# Patient Record
Sex: Male | Born: 1985 | Race: White | Hispanic: No | Marital: Married
Health system: Southern US, Academic
[De-identification: ages and names within clinical notes are randomized; demographics above are authoritative.]

---

## 2006-05-08 ENCOUNTER — Emergency Department (HOSPITAL_COMMUNITY): Payer: Self-pay | Admitting: EXTERNAL

## 2017-07-09 ENCOUNTER — Inpatient Hospital Stay: Payer: Self-pay

## 2017-07-09 ENCOUNTER — Emergency Department: Payer: Self-pay

## 2017-07-09 ENCOUNTER — Inpatient Hospital Stay
Admission: EM | Admit: 2017-07-09 | Discharge: 2017-07-12 | DRG: 917 | Disposition: A | Discharge status: Home / Self Care | Payer: Self-pay | Attending: Internal Medicine | Admitting: Internal Medicine

## 2017-07-09 ENCOUNTER — Other Ambulatory Visit: Payer: Self-pay

## 2017-07-09 DIAGNOSIS — T50992A Poisoning by other drugs, medicaments and biological substances, intentional self-harm, initial encounter: Secondary | ICD-10-CM

## 2017-07-09 DIAGNOSIS — Z4659 Encounter for fitting and adjustment of other gastrointestinal appliance and device: Secondary | ICD-10-CM

## 2017-07-09 DIAGNOSIS — T50902A Poisoning by unspecified drugs, medicaments and biological substances, intentional self-harm, initial encounter: Secondary | ICD-10-CM

## 2017-07-09 DIAGNOSIS — G9340 Encephalopathy, unspecified: Secondary | ICD-10-CM | POA: Diagnosis present

## 2017-07-09 DIAGNOSIS — R000 Tachycardia, unspecified: Secondary | ICD-10-CM | POA: Diagnosis present

## 2017-07-09 DIAGNOSIS — F1110 Opioid abuse, uncomplicated: Secondary | ICD-10-CM | POA: Diagnosis present

## 2017-07-09 DIAGNOSIS — Z91040 Latex allergy status: Secondary | ICD-10-CM

## 2017-07-09 DIAGNOSIS — F419 Anxiety disorder, unspecified: Secondary | ICD-10-CM | POA: Diagnosis present

## 2017-07-09 DIAGNOSIS — R319 Hematuria, unspecified: Secondary | ICD-10-CM | POA: Diagnosis present

## 2017-07-09 DIAGNOSIS — IMO0001 Reserved for inherently not codable concepts without codable children: Secondary | ICD-10-CM

## 2017-07-09 DIAGNOSIS — F329 Major depressive disorder, single episode, unspecified: Secondary | ICD-10-CM | POA: Diagnosis present

## 2017-07-09 DIAGNOSIS — I959 Hypotension, unspecified: Secondary | ICD-10-CM | POA: Diagnosis present

## 2017-07-09 DIAGNOSIS — T43201A Poisoning by unspecified antidepressants, accidental (unintentional), initial encounter: Secondary | ICD-10-CM | POA: Diagnosis present

## 2017-07-09 DIAGNOSIS — J9600 Acute respiratory failure, unspecified whether with hypoxia or hypercapnia: Secondary | ICD-10-CM | POA: Diagnosis present

## 2017-07-09 DIAGNOSIS — R258 Other abnormal involuntary movements: Secondary | ICD-10-CM | POA: Diagnosis present

## 2017-07-09 DIAGNOSIS — T50901A Poisoning by unspecified drugs, medicaments and biological substances, accidental (unintentional), initial encounter: Secondary | ICD-10-CM

## 2017-07-09 DIAGNOSIS — R410 Disorientation, unspecified: Secondary | ICD-10-CM

## 2017-07-09 DIAGNOSIS — R338 Other retention of urine: Secondary | ICD-10-CM | POA: Diagnosis present

## 2017-07-09 DIAGNOSIS — Z781 Physical restraint status: Secondary | ICD-10-CM

## 2017-07-09 DIAGNOSIS — J9601 Acute respiratory failure with hypoxia: Secondary | ICD-10-CM

## 2017-07-09 DIAGNOSIS — T43212A Poisoning by selective serotonin and norepinephrine reuptake inhibitors, intentional self-harm, initial encounter: Principal | ICD-10-CM | POA: Diagnosis present

## 2017-07-09 LAB — CBC WITH DIFFERENTIAL/PLATELET
BASOS ABS: 0.1 10*3/uL (ref 0–0.1)
BASOS PCT: 0 %
BASOS PCT: 0 %
Basophils Absolute: 0.1 10*3/uL (ref 0–0.1)
EOS ABS: 0 10*3/uL (ref 0–0.7)
EOS PCT: 2 %
EOS PCT: 0 %
Eosinophils Absolute: 0.2 10*3/uL (ref 0–0.7)
HCT: 41.9 % (ref 40.0–52.0)
HEMATOCRIT: 46.9 % (ref 40.0–52.0)
Hemoglobin: 15.7 g/dL (ref 13.0–18.0)
Hemoglobin: 13.9 g/dL (ref 13.0–18.0)
Lymphocytes Relative: 8 %
Lymphocytes Relative: 5 %
Lymphs Abs: 1.1 10*3/uL (ref 1.0–3.6)
Lymphs Abs: 0.9 10*3/uL — ABNORMAL LOW (ref 1.0–3.6)
MCH: 31.2 pg (ref 26.0–34.0)
MCH: 30.7 pg (ref 26.0–34.0)
MCHC: 33.4 g/dL (ref 32.0–36.0)
MCHC: 33.2 g/dL (ref 32.0–36.0)
MCV: 93.5 fL (ref 80.0–100.0)
MCV: 92.6 fL (ref 80.0–100.0)
MONO ABS: 1.3 10*3/uL — AB (ref 0.2–1.0)
MONO ABS: 1.7 10*3/uL — AB (ref 0.2–1.0)
MONOS PCT: 9 %
Monocytes Relative: 9 %
Neutro Abs: 12.4 10*3/uL — ABNORMAL HIGH (ref 1.4–6.5)
Neutro Abs: 15.6 10*3/uL — ABNORMAL HIGH (ref 1.4–6.5)
Neutrophils Relative %: 81 %
Neutrophils Relative %: 86 %
PLATELETS: 186 10*3/uL (ref 150–440)
PLATELETS: 180 10*3/uL (ref 150–440)
RBC: 5.02 MIL/uL (ref 4.40–5.90)
RBC: 4.53 MIL/uL (ref 4.40–5.90)
RDW: 13.8 % (ref 11.5–14.5)
RDW: 13.4 % (ref 11.5–14.5)
WBC: 15.2 10*3/uL — ABNORMAL HIGH (ref 3.8–10.6)
WBC: 18.1 10*3/uL — ABNORMAL HIGH (ref 3.8–10.6)

## 2017-07-09 LAB — URINE DRUG SCREEN, QUALITATIVE (ARMC ONLY)
Amphetamines, Ur Screen: POSITIVE — AB
BENZODIAZEPINE, UR SCRN: POSITIVE — AB
CANNABINOID 50 NG, UR ~~LOC~~: POSITIVE — AB
Cocaine Metabolite,Ur ~~LOC~~: NOT DETECTED
MDMA (Ecstasy)Ur Screen: NOT DETECTED
METHADONE SCREEN, URINE: NOT DETECTED
OPIATE, UR SCREEN: NOT DETECTED
PHENCYCLIDINE (PCP) UR S: POSITIVE — AB
Tricyclic, Ur Screen: NOT DETECTED

## 2017-07-09 LAB — BLOOD GAS, ARTERIAL
ACID-BASE DEFICIT: 1.2 mmol/L (ref 0.0–2.0)
Acid-base deficit: 0.9 mmol/L (ref 0.0–2.0)
Bicarbonate: 24.3 mmol/L (ref 20.0–28.0)
Bicarbonate: 22.7 mmol/L (ref 20.0–28.0)
DELIVERY SYSTEMS: POSITIVE
Expiratory PAP: 5
FIO2: 0.4
FIO2: 0.6
INSPIRATORY PAP: 15
MECHANICAL RATE: 10
O2 SAT: 99.4 %
O2 Saturation: 95 %
PCO2 ART: 41 mmHg (ref 32.0–48.0)
PCO2 ART: 35 mmHg (ref 32.0–48.0)
PEEP: 5 cmH2O
Patient temperature: 37
Patient temperature: 37
RATE: 16 resp/min
VT: 500 mL
pH, Arterial: 7.38 (ref 7.350–7.450)
pH, Arterial: 7.42 (ref 7.350–7.450)
pO2, Arterial: 158 mmHg — ABNORMAL HIGH (ref 83.0–108.0)
pO2, Arterial: 74 mmHg — ABNORMAL LOW (ref 83.0–108.0)

## 2017-07-09 LAB — TRIGLYCERIDES: Triglycerides: 152 mg/dL — ABNORMAL HIGH (ref <150)

## 2017-07-09 LAB — COMPREHENSIVE METABOLIC PANEL
ALT: 12 U/L (ref 0–44)
AST: 26 U/L (ref 15–41)
Albumin: 3.6 g/dL (ref 3.5–5.0)
Alkaline Phosphatase: 63 U/L (ref 38–126)
Anion gap: 8 (ref 5–15)
BILIRUBIN TOTAL: 0.3 mg/dL (ref 0.3–1.2)
BUN: 10 mg/dL (ref 6–20)
CALCIUM: 8 mg/dL — AB (ref 8.9–10.3)
CO2: 23 mmol/L (ref 22–32)
Chloride: 109 mmol/L (ref 98–111)
Creatinine, Ser: 1.19 mg/dL (ref 0.61–1.24)
GFR calc Af Amer: >60 mL/min (ref >60)
Glucose, Bld: 87 mg/dL (ref 70–99)
Potassium: 3.5 mmol/L (ref 3.5–5.1)
Sodium: 140 mmol/L (ref 135–145)
TOTAL PROTEIN: 6.1 g/dL — AB (ref 6.5–8.1)

## 2017-07-09 LAB — PHOSPHORUS: Phosphorus: 4 mg/dL (ref 2.5–4.6)

## 2017-07-09 LAB — BASIC METABOLIC PANEL
Anion gap: 8 (ref 5–15)
BUN: 11 mg/dL (ref 6–20)
CO2: 20 mmol/L — ABNORMAL LOW (ref 22–32)
CREATININE: 0.89 mg/dL (ref 0.61–1.24)
Calcium: 7.5 mg/dL — ABNORMAL LOW (ref 8.9–10.3)
Chloride: 112 mmol/L — ABNORMAL HIGH (ref 98–111)
GFR calc Af Amer: >60 mL/min (ref >60)
GLUCOSE: 97 mg/dL (ref 70–99)
Potassium: 3.8 mmol/L (ref 3.5–5.1)
SODIUM: 140 mmol/L (ref 135–145)

## 2017-07-09 LAB — LACTIC ACID, PLASMA
LACTIC ACID, VENOUS: 3.6 mmol/L — AB (ref 0.5–1.9)
Lactic Acid, Venous: 2.4 mmol/L (ref 0.5–1.9)

## 2017-07-09 LAB — CK: Total CK: 130 U/L (ref 49–397)

## 2017-07-09 LAB — GLUCOSE, CAPILLARY: GLUCOSE-CAPILLARY: 148 mg/dL — AB (ref 70–99)

## 2017-07-09 LAB — PROTIME-INR
INR: 1.13
Prothrombin Time: 14.4 seconds (ref 11.4–15.2)

## 2017-07-09 LAB — ETHANOL

## 2017-07-09 LAB — MAGNESIUM: Magnesium: 1.6 mg/dL — ABNORMAL LOW (ref 1.7–2.4)

## 2017-07-09 LAB — MRSA PCR SCREENING: MRSA by PCR: NEGATIVE

## 2017-07-09 LAB — ACETAMINOPHEN LEVEL: Acetaminophen (Tylenol), Serum: <10 ug/mL — ABNORMAL LOW (ref 10–30)

## 2017-07-09 LAB — SALICYLATE LEVEL: Salicylate Lvl: <7 mg/dL (ref 2.8–30.0)

## 2017-07-09 MED ORDER — ACTIDOSE WITH SORBITOL 50 GM/240ML PO LIQD
50.0000 g | Freq: Once | ORAL | Status: AC
  Administered 2017-07-09: 50 g via ORAL

## 2017-07-09 MED ORDER — FENTANYL CITRATE (PF) 100 MCG/2ML IJ SOLN: 50.0000 ug | Freq: Once | INTRAMUSCULAR | Status: DC

## 2017-07-09 MED ORDER — CYPROHEPTADINE HCL 4 MG PO TABS
2.0000 mg | ORAL_TABLET | ORAL | Status: AC
  Administered 2017-07-09 (×3): 2 mg via ORAL
  Filled 2017-07-09 (×12): qty 1

## 2017-07-09 MED ORDER — DEXMEDETOMIDINE HCL IN NACL 400 MCG/100ML IV SOLN
0.4000 ug/kg/h | INTRAVENOUS | Status: DC
  Administered 2017-07-09 (×2): 0.4 ug/kg/h via INTRAVENOUS
  Administered 2017-07-10: 0.2 ug/kg/h via INTRAVENOUS
  Administered 2017-07-10: 1.6 ug/kg/h via INTRAVENOUS
  Administered 2017-07-10: 1.8 ug/kg/h via INTRAVENOUS
  Filled 2017-07-09 (×6): qty 100

## 2017-07-09 MED ORDER — SODIUM CHLORIDE 0.9 % IV SOLN: 250.0000 mL | INTRAVENOUS | Status: DC | PRN

## 2017-07-09 MED ORDER — SODIUM BICARBONATE 8.4 % IV SOLN
50.0000 meq | Freq: Once | INTRAVENOUS | Status: AC
  Administered 2017-07-09: 50 meq via INTRAVENOUS
  Filled 2017-07-09: qty 50

## 2017-07-09 MED ORDER — FENTANYL CITRATE (PF) 100 MCG/2ML IJ SOLN
100.0000 ug | Freq: Once | INTRAMUSCULAR | Status: AC
Start: 2017-07-09 — End: 2017-07-09
  Administered 2017-07-09: 100 ug via INTRAVENOUS

## 2017-07-09 MED ORDER — NOREPINEPHRINE 4 MG/250ML-% IV SOLN
0.0000 ug/min | INTRAVENOUS | Status: DC
  Administered 2017-07-09: 4 ug/min via INTRAVENOUS
  Filled 2017-07-09: qty 250

## 2017-07-09 MED ORDER — LORAZEPAM 2 MG/ML IJ SOLN
4.0000 mg | Freq: Once | INTRAMUSCULAR | Status: AC
  Administered 2017-07-09: 4 mg via INTRAVENOUS

## 2017-07-09 MED ORDER — FENTANYL CITRATE (PF) 100 MCG/2ML IJ SOLN
100.0000 ug | INTRAMUSCULAR | Status: DC | PRN
  Filled 2017-07-09: qty 2

## 2017-07-09 MED ORDER — CHARCOAL ACTIVATED PO LIQD
ORAL | Status: AC
  Filled 2017-07-09: qty 240

## 2017-07-09 MED ORDER — CYPROHEPTADINE HCL 4 MG PO TABS
4.0000 mg | ORAL_TABLET | Freq: Once | ORAL | Status: DC
  Filled 2017-07-09: qty 1

## 2017-07-09 MED ORDER — SODIUM CHLORIDE 0.9 % IV BOLUS
2000.0000 mL | Freq: Once | INTRAVENOUS | Status: AC
  Administered 2017-07-09: 2000 mL via INTRAVENOUS

## 2017-07-09 MED ORDER — FAMOTIDINE IN NACL 20-0.9 MG/50ML-% IV SOLN
20.0000 mg | INTRAVENOUS | Status: DC
  Administered 2017-07-09: 20 mg via INTRAVENOUS
  Filled 2017-07-09: qty 50

## 2017-07-09 MED ORDER — PROPOFOL 1000 MG/100ML IV EMUL
5.0000 ug/kg/min | Freq: Once | INTRAVENOUS | Status: DC
  Administered 2017-07-09: 20 ug/kg/min via INTRAVENOUS

## 2017-07-09 MED ORDER — ENOXAPARIN SODIUM 40 MG/0.4ML ~~LOC~~ SOLN: 40.0000 mg | SUBCUTANEOUS | Status: DC

## 2017-07-09 MED ORDER — LORAZEPAM 2 MG/ML IJ SOLN
2.0000 mg | Freq: Four times a day (QID) | INTRAMUSCULAR | Status: DC
  Administered 2017-07-09 – 2017-07-10 (×4): 2 mg via INTRAVENOUS
  Filled 2017-07-09 (×4): qty 1

## 2017-07-09 MED ORDER — LORAZEPAM 2 MG/ML IJ SOLN: 1.0000 mg | INTRAMUSCULAR | Status: DC | PRN

## 2017-07-09 MED ORDER — SODIUM CHLORIDE 0.9 % IV BOLUS
1000.0000 mL | Freq: Once | INTRAVENOUS | Status: AC
  Administered 2017-07-09: 1000 mL via INTRAVENOUS

## 2017-07-09 MED ORDER — PROPOFOL 1000 MG/100ML IV EMUL
INTRAVENOUS | Status: AC
  Administered 2017-07-09: 20 ug/kg/min via INTRAVENOUS
  Filled 2017-07-09: qty 100

## 2017-07-09 MED ORDER — LACTATED RINGERS IV SOLN
INTRAVENOUS | Status: DC
  Administered 2017-07-09: 05:00:00 via INTRAVENOUS

## 2017-07-09 MED ORDER — LORAZEPAM 2 MG/ML IJ SOLN
1.0000 mg | INTRAMUSCULAR | Status: DC | PRN
  Administered 2017-07-10: 2 mg via INTRAVENOUS
  Administered 2017-07-11 (×3): 1 mg via INTRAVENOUS
  Filled 2017-07-09 (×4): qty 1

## 2017-07-09 MED ORDER — ROCURONIUM BROMIDE 50 MG/5ML IV SOLN
100.0000 mg | Freq: Once | INTRAVENOUS | Status: AC
  Administered 2017-07-09: 100 mg via INTRAVENOUS

## 2017-07-09 MED ORDER — PROPOFOL 1000 MG/100ML IV EMUL
0.0000 ug/kg/min | INTRAVENOUS | Status: DC
  Administered 2017-07-09: 10 ug/kg/min via INTRAVENOUS
  Filled 2017-07-09: qty 100

## 2017-07-09 MED ORDER — KETAMINE HCL 10 MG/ML IJ SOLN
100.0000 mg | Freq: Once | INTRAMUSCULAR | Status: AC
  Administered 2017-07-09: 100 mg via INTRAVENOUS

## 2017-07-09 MED ORDER — FENTANYL 2500MCG IN NS 250ML (10MCG/ML) PREMIX INFUSION: 100.0000 ug/h | INTRAVENOUS | Status: DC

## 2017-07-09 MED ORDER — MAGNESIUM SULFATE 2 GM/50ML IV SOLN
2.0000 g | Freq: Once | INTRAVENOUS | Status: AC
  Administered 2017-07-09: 2 g via INTRAVENOUS

## 2017-07-09 MED ORDER — PROPOFOL 1000 MG/100ML IV EMUL: 5.0000 ug/kg/min | Freq: Once | INTRAVENOUS | Status: DC

## 2017-07-09 MED ORDER — ZIPRASIDONE MESYLATE 20 MG IM SOLR
20.0000 mg | INTRAMUSCULAR | Status: DC | PRN
  Administered 2017-07-09 – 2017-07-10 (×2): 20 mg via INTRAMUSCULAR
  Filled 2017-07-09 (×3): qty 20

## 2017-07-09 MED ORDER — DEXMEDETOMIDINE HCL IN NACL 400 MCG/100ML IV SOLN
INTRAVENOUS | Status: AC
  Administered 2017-07-09: 0.4 ug/kg/h via INTRAVENOUS
  Filled 2017-07-09: qty 100

## 2017-07-09 MED ORDER — FENTANYL CITRATE (PF) 100 MCG/2ML IJ SOLN
100.0000 ug | INTRAMUSCULAR | Status: DC | PRN
  Administered 2017-07-09: 100 ug via INTRAVENOUS

## 2017-07-09 MED ORDER — NOREPINEPHRINE 4 MG/250ML-% IV SOLN
0.0000 ug/min | Freq: Once | INTRAVENOUS | Status: AC
  Administered 2017-07-09: 2 ug/min via INTRAVENOUS
  Filled 2017-07-09: qty 250

## 2017-07-09 MED ORDER — DIAZEPAM 5 MG/ML IJ SOLN
5.0000 mg | Freq: Once | INTRAMUSCULAR | Status: AC
  Administered 2017-07-09: 5 mg via INTRAVENOUS
  Filled 2017-07-09: qty 2

## 2017-07-09 MED ORDER — POTASSIUM CHLORIDE 2 MEQ/ML IV SOLN
INTRAVENOUS | Status: DC
  Administered 2017-07-09 – 2017-07-10 (×2): via INTRAVENOUS
  Filled 2017-07-09 (×4): qty 1000

## 2017-07-09 MED ORDER — MAGNESIUM SULFATE 2 GM/50ML IV SOLN
2.0000 g | Freq: Once | INTRAVENOUS | Status: AC
  Administered 2017-07-09: 2 g via INTRAVENOUS
  Filled 2017-07-09: qty 50

## 2017-07-09 MED ORDER — POTASSIUM CHLORIDE 10 MEQ/100ML IV SOLN
10.0000 meq | INTRAVENOUS | Status: AC
  Administered 2017-07-09 (×2): 10 meq via INTRAVENOUS
  Filled 2017-07-09 (×2): qty 100

## 2017-07-09 NOTE — Progress Notes (Signed)
Pharmacy Electrolyte Monitoring Consult:  Pharmacy consulted to assist in monitoring and replacing electrolytes in this 32 y.o. male admitted on 07/09/2017 with Drug Overdose  MIVF: LR @ 7375mL/hr.    Labs:  Sodium (mmol/L)  Date Value  07/09/2017 140   Potassium (mmol/L)  Date Value  07/09/2017 3.5   Magnesium (mg/dL)  Date Value  16/10/960406/26/2019 1.6 (L)   Phosphorus (mg/dL)  Date Value  54/09/811906/26/2019 4.0   Calcium (mg/dL)  Date Value  14/78/295606/26/2019 8.0 (L)   Albumin (g/dL)  Date Value  21/30/865706/26/2019 3.6    Assessment/Plan: Patient ordered magnesium 2g IV X 1. Will order additional magnesium 2g IV x 1 to maintain magnesium ~ 2.   Will change MIVF to LR/5340mEq of Potassium @75mL /hr to maintain potassium ~ 4.   Patient received NS 1000mL bolus x 2 due to elevated LA.   Will recheck electrolytes with am labs.   Pharmacy will continue to monitor and adjust per consult.   Eric Davila L 07/09/2017 2:01 PM

## 2017-07-09 NOTE — Procedures (Signed)
ELECTROENCEPHALOGRAM REPORT   Patient: Eric Davila       Room #: IC04A-AA EEG No. ID: [id number] Age: 32 y.o.        Sex: male Referring Physician: Salary Report Date:  07/09/2017        Interpreting Physician: Thana FarrEYNOLDS, Athalee Esterline  History: Eric Davila is an 32 y.o. male s/p antidepressant overdose  Medications:  Periactin, Levophed, Diprivan  Conditions of Recording:  This is a 16 channel EEG carried out with the patient in the awake state.  Description:  The waking background activity is dominated by muscle and movement artifact that often obscures the back ground rhythm.  When able to be visualized the background rhythm consists of a low to moderate voltage polymorphic delta activity.  There is on occasion some intermixed theta activity as well.  This activity is continuous and diffusely distributed over both hemispheres.   No epileptiform activity is noted.   Hyperventilation and ntermittent photic stimulation were not performed.   IMPRESSION: This is an abnormal EEG secondary to general background slowing.  This finding may be seen with a diffuse disturbance that is etiologically nonspecific, but may include a metabolic encephalopathy or medication effect, among other possibilities.  No epileptiform activity was noted.     Thana FarrLeslie Annlouise Gerety, MD Neurology [phone] 07/09/2017, 12:39 PM

## 2017-07-09 NOTE — H&P (Addendum)
[facility] -  at [facility]   PATIENT NAME: Eric Davila    MR#:  [id number]  DATE OF BIRTH:  [birth year]  DATE OF ADMISSION:  07/09/2017  PRIMARY CARE PHYSICIAN: Patient, No Pcp Per   REQUESTING/REFERRING PHYSICIAN: Merrily Brittle, MD  CHIEF COMPLAINT:   Chief Complaint  Patient presents with  . Drug Overdose   HISTORY OF PRESENT ILLNESS:  Eric Davila  is a 32 y.o. male with a known history of anxiety/depression p/w intentional overdose, Cymbalta + Effexor. It is unknown if pt took any other medications. He was previously on Celexa for his anxiety/depression, based on prior outpatient documentation. The medications he took were his wife's. It is unknown why he overdosed on these medications. He was apparently found down with pill bottles on the floor. He took at least 7x Effexor 75mg  (525mg ) and possibly as many as 30x Cymbalta 30mg  (900mg  max). EMS was called. He reportedly displayed seizure activity (vs. clonus) en route to ED, and received Versed. He was highly agitated in the ED, and received Ativan. Tachycardia, agitation and clonus continued. He was ultimately intubated for airway protection. As such, he cannot provide Hx/ROS.  PAST MEDICAL HISTORY:  History reviewed. No pertinent past medical history.   Anxiety/depression Asthma Headaches Gastritis PAST SURGICAL HISTORY:  History reviewed. No pertinent surgical history.   Dental extractions SOCIAL HISTORY:   Social History   Tobacco Use  . Smoking status: Not on file  Substance Use Topics  . Alcohol use: Not on file   Married Per prior documentation, pt used to smoke 1/2ppd x~22yrs+ Per prior documentation, occasional EtOH  FAMILY HISTORY:  History reviewed. No pertinent family history.   Mother w/ anxiety/depression DRUG ALLERGIES:  Not on File  REVIEW OF SYSTEMS:   Review of Systems  Unable to perform ROS: Intubated  Psychiatric/Behavioral: Positive for depression.    MEDICATIONS AT HOME:   Prior to Admission medications   Not on File   VITAL SIGNS:  Blood pressure (!) 80/50, pulse (!) 102, resp. rate 20, height 6' (1.829 m), weight 68 kg (150 lb), SpO2 98 %.  PHYSICAL EXAMINATION:  Physical Exam  Constitutional: He appears well-developed and well-nourished.  Non-toxic appearance. He does not have a sickly appearance. He does not appear ill. No distress. He is sedated and intubated.  HENT:  Head: Normocephalic.  Mouth/Throat: Oropharynx is clear and moist. No oropharyngeal exudate.  Eyes: Conjunctivae, EOM and lids are normal. No scleral icterus.  Neck: Neck supple. No JVD present. No thyromegaly present.  Cardiovascular: Regular rhythm, S1 normal, S2 normal and normal heart sounds.  No extrasystoles are present. Tachycardia present. Exam reveals no gallop, no S3, no S4, no distant heart sounds and no friction rub.  No murmur heard. Pulmonary/Chest: Effort normal and breath sounds normal. No accessory muscle usage or stridor. No bradypnea. He is intubated. No respiratory distress. He has no decreased breath sounds. He has no wheezes. He has no rhonchi. He has no rales.  Abdominal: Soft. He exhibits no distension. There is no tenderness. There is no rebound and no guarding.  Musculoskeletal: He exhibits no edema or deformity.  Lymphadenopathy:    He has no cervical adenopathy.  Neurological: He is unresponsive.  Skin: Skin is warm, dry and intact. No rash noted. He is not diaphoretic. No erythema.  Psychiatric: He expresses suicidal ideation.   LABORATORY PANEL:   CBC Recent Labs  Lab 07/09/17 0204  WBC 15.2*  HGB 15.7  HCT 46.9  PLT 186   ------------------------------------------------------------------------------------------------------------------  Chemistries  Recent Labs  Lab 07/09/17 0204  NA 140  K 3.5  CL 109  CO2 23  GLUCOSE 87  BUN 10  CREATININE 1.19  CALCIUM 8.0*  AST 26  ALT 12  ALKPHOS 63  BILITOT 0.3    ------------------------------------------------------------------------------------------------------------------  Cardiac Enzymes No results for input(s): TROPONINI in the last 168 hours. ------------------------------------------------------------------------------------------------------------------  RADIOLOGY:  Dg Abdomen 1 View  Result Date: 07/09/2017 CLINICAL DATA:  32 y/o  M; post intubation. EXAM: ABDOMEN - 1 VIEW COMPARISON:  None. FINDINGS: Normal bowel gas pattern. Enteric tube tip projects over the gastroesophageal junction, advancement recommended. Mild levocurvature of the lumbar spine. No acute osseous abnormality is evident. IMPRESSION: Enteric tube tip projects over gastroesophageal junction, advancement recommended. Electronically Signed   By: Mitzi HansenLance  Furusawa-Stratton M.D.   On: 07/09/2017 02:42   Dg Chest Port 1 View  Result Date: 07/09/2017 CLINICAL DATA:  32 y/o  M; post intubation. EXAM: PORTABLE CHEST 1 VIEW COMPARISON:  None. FINDINGS: Endotracheal tube tip projects 4.4 cm above the carina. Enteric tube tip extends below the field of view. Clear lungs. No pneumothorax or pleural effusion. Bones are unremarkable. IMPRESSION: 1. Endotracheal tube tip 4.4 cm above carina. 2. Clear lungs. Electronically Signed   By: Mitzi HansenLance  Furusawa-Stratton M.D.   On: 07/09/2017 02:40   IMPRESSION AND PLAN:   A/P: 60M intentional antidepressant overdose (Cymbalta + Effexor), intubated. Leukocytosis, hypocalcemia, hypomagnesemia. -Activated charcoal in ED -Admit to ICU -Continuous cardiac monitoring -Pulse ox -Intubated, Propofol/Fentanyl -Poison control - monitor for hypotension, QTc prolongation, seizures, metabolic/electrolyte abnormalities, Serotonin syndrome -Hypotensive, Levophed -Labs unimpressive thus far -IVF -Cyproheptadine for Serotonin Syndrome -EEG to evaluate for seizure activity -Monitor EKG (QTc) -Supportive care -Ionized calcium pending -Replete Mag -No home  meds -NPO -Lovenox -Full code -Admission, > 2 midnights   All the records are reviewed and case discussed with ED provider. Management plans discussed with the patient, family and they are in agreement.  CODE STATUS: Full code  TOTAL TIME TAKING CARE OF THIS PATIENT: 90 minutes.    Barbaraann RondoPrasanna Annetta Deiss M.D on 07/09/2017 at 3:00 AM  Between 7am to 6pm - Pager - [phone]  After 6pm go to [email or web address] - Social research officer, governmentpassword [email or web address]  [facility]  Office  [phone]  CC: Primary care physician; Patient, No Pcp Per   Note: This dictation was prepared with Dragon dictation along with smaller phrase technology. Any transcriptional errors that result from this process are unintentional.

## 2017-07-09 NOTE — Consult Note (Signed)
[facility] Consult   Reason for Consult: Consult for 32 year old man who came into the emergency room last night agitated and confused after an overdose Referring Physician: Salary Patient Identification: Laron Boorman MRN:  [id number] Principal Diagnosis: Acute delirium Diagnosis:   Patient Active Problem List   Diagnosis Date Noted  . Antidepressant overdose [T43.201A] 07/09/2017  . Overdose [T50.901A] 07/09/2017  . Acute delirium [R41.0] 07/09/2017  . 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 male patient admitted with patient not able to give information.  HPI: Patient seen.  Chart reviewed.  Read through the notes of this admission and looked back over some of his other old medical history.  Spoke with representative from [facility] today who is following up on the patient.  Also spoke to the patient's wife by phone to get more detail about what happened.  32 year old man came to the emergency room last night by EMS with a report that he had taken an overdose of his wife's antidepressant medicines.  Patient was described as agitated and confused even after having received some Versed in the ambulance.  Patient required sedation and intubation for airway protection in the emergency room.  On evaluation this afternoon he is unable to speak coherently.  He was able to make gestures to indicate that he understood that I was speaking to him but he could not communicate much of anything lucid back to me.  Seems to be still intermittently confused writhing around in bed.  Patient remains tachycardic.  Not highly febrile.  EEG did not show epileptiform findings just diffuse slowing.  I looked for a urine drug screen but although one was ordered in the emergency room it looks like in the case to transfer him to the intensive care unit that order might of been overlooked.  Spoke with his wife.  She says  that yesterday she and he had been arguing all day, which is typical of them, and then at the climax of the argument he swallowed all of her antidepressants.  She says this includes Effexor Cymbalta but also Wellbutrin which had not been previously noted.  She says at first he did not show symptoms and in fact left the house for a while and only after he came back did he start to shake and look like he was having a seizure.  Social history: Not regularly working.  Lives with his wife and daughter.  Sounds like it is a chronically dramatic environment at home  Medical history: Patient has been prescribed some medicines for anxiety in the past but is not currently taking any prescription medicines as far as his wife knows.  No significant past medical problems.  No known past history of seizures.  Substance abuse history: Patient was not able to give information but his wife says that he abuses pills including a lot of narcotics and benzodiazepines regularly.  She also says that he drinks heavily drinking probably multiple beers and a fair bit of liquor on an average day.  Unknown when his last consumption was but his alcohol level was negative on presentation.  She does not know what if any drugs he might of consumed in addition to the overdose    Past Psychiatric History: Patient has complained of anxiety chronically mostly in an attempt to be prescribed Xanax from what I can see in the old notes.  Has been prescribed Zoloft  before but has not been compliant.  No known previous hospitalizations or suicide attempts  Risk to Self:   Risk to Others:   Prior Inpatient Therapy:   Prior Outpatient Therapy:    Past Medical History: History reviewed. No pertinent past medical history. History reviewed. No pertinent surgical history. Family History: History reviewed. No pertinent family history. Family Psychiatric  History: Unknown about his biological family other than that his daughter apparently also  has behavior and possibly substance abuse issues Social History:  Social History   Substance and Sexual Activity  Alcohol Use Not Currently     Social History   Substance and Sexual Activity  Drug Use Not Currently    Social History   Socioeconomic History  . Marital status: Married    Spouse name: Not on file  . Number of children: Not on file  . Years of education: Not on file  . Highest education level: Not on file  Occupational History  . Not on file  Social Needs  . Financial resource strain: Not on file  . Food insecurity:    Worry: Not on file    Inability: Not on file  . Transportation needs:    Medical: Not on file    Non-medical: Not on file  Tobacco Use  . Smoking status: Unknown If Ever Smoked  Substance and Sexual Activity  . Alcohol use: Not Currently  . Drug use: Not Currently  . Sexual activity: Yes    Partners: Female  Lifestyle  . Physical activity:    Days per week: Not on file    Minutes per session: Not on file  . Stress: Not on file  Relationships  . Social connections:    Talks on phone: Not on file    Gets together: Not on file    Attends religious service: Not on file    Active member of club or organization: Not on file    Attends meetings of clubs or organizations: Not on file    Relationship status: Not on file  Other Topics Concern  . Not on file  Social History Narrative  . Not on file   Additional Social History:    Allergies:  Not on File  Labs:  Results for orders placed or performed during the hospital encounter of 07/09/17 (from the past 48 hour(s))  Blood gas, arterial     Status: Abnormal   Collection Time: 07/09/17  2:02 AM  Result Value Ref Range   FIO2 0.40    Delivery systems VENTILATOR    Mode ASSIST CONTROL    VT 500 mL   LHR 16 resp/min   Peep/cpap 5.0 cm H20   pH, Arterial 7.38 7.350 - 7.450   pCO2 arterial 41 32.0 - 48.0 mmHg   pO2, Arterial 158 (H) 83.0 - 108.0 mmHg   Bicarbonate 24.3 20.0 - 28.0  mmol/L   Acid-base deficit 0.9 0.0 - 2.0 mmol/L   O2 Saturation 99.4 %   Patient temperature 37.0    Collection site LEFT RADIAL    Sample type ARTERIAL DRAW    Allens test (pass/fail) PASS PASS    Comment: Performed at [facility], [address] [place], [place] [address]  Acetaminophen level     Status: Abnormal   Collection Time: 07/09/17  2:04 AM  Result Value Ref Range   Acetaminophen (Tylenol), Serum <10 (L) 10 - 30 ug/mL    Comment: (NOTE) Therapeutic concentrations vary significantly. A range of 10-30 ug/mL  may  be an effective concentration for many patients. However, some  are best treated at concentrations outside of this range. Acetaminophen concentrations >150 ug/mL at 4 hours after ingestion  and >50 ug/mL at 12 hours after ingestion are often associated with  toxic reactions. Performed at [facility], [address] [place], [place] [address]   Comprehensive metabolic panel     Status: Abnormal   Collection Time: 07/09/17  2:04 AM  Result Value Ref Range   Sodium 140 135 - 145 mmol/L   Potassium 3.5 3.5 - 5.1 mmol/L   Chloride 109 98 - 111 mmol/L    Comment: Please note change in reference range.   CO2 23 22 - 32 mmol/L   Glucose, Bld 87 70 - 99 mg/dL    Comment: Please note change in reference range.   BUN 10 6 - 20 mg/dL    Comment: Please note change in reference range.   Creatinine, Ser 1.19 0.61 - 1.24 mg/dL   Calcium 8.0 (L) 8.9 - 10.3 mg/dL   Total Protein 6.1 (L) 6.5 - 8.1 g/dL   Albumin 3.6 3.5 - 5.0 g/dL   AST 26 15 - 41 U/L   ALT 12 0 - 44 U/L    Comment: Please note change in reference range.   Alkaline Phosphatase 63 38 - 126 U/L   Total Bilirubin 0.3 0.3 - 1.2 mg/dL   GFR calc non Af Amer >60 >60 mL/min   GFR calc Af Amer >60 >60 mL/min    Comment: (NOTE) The eGFR has been calculated using the CKD EPI equation. This calculation has not been validated in all clinical situations. eGFR's persistently <60 mL/min signify  possible Chronic Kidney Disease.    Anion gap 8 5 - 15    Comment: Performed at [facility], [address] [place], [place] [address]  Ethanol     Status: None   Collection Time: 07/09/17  2:04 AM  Result Value Ref Range   Alcohol, Ethyl (B) <10 <10 mg/dL    Comment: (NOTE) Lowest detectable limit for serum alcohol is 10 mg/dL. For medical purposes only. Performed at [facility], [address] [place], [place] [address]   Salicylate level     Status: None   Collection Time: 07/09/17  2:04 AM  Result Value Ref Range   Salicylate Lvl <4.8 2.8 - 30.0 mg/dL    Comment: Performed at [facility], [address] [place], [place] [address]  CBC with Differential     Status: Abnormal   Collection Time: 07/09/17  2:04 AM  Result Value Ref Range   WBC 15.2 (H) 3.8 - 10.6 K/uL   RBC 5.02 4.40 - 5.90 MIL/uL   Hemoglobin 15.7 13.0 - 18.0 g/dL   HCT 46.9 40.0 - 52.0 %   MCV 93.5 80.0 - 100.0 fL   MCH 31.2 26.0 - 34.0 pg   MCHC 33.4 32.0 - 36.0 g/dL   RDW 13.8 11.5 - 14.5 %   Platelets 186 150 - 440 K/uL   Neutrophils Relative % 81 %   Neutro Abs 12.4 (H) 1.4 - 6.5 K/uL   Lymphocytes Relative 8 %   Lymphs Abs 1.1 1.0 - 3.6 K/uL   Monocytes Relative 9 %   Monocytes Absolute 1.3 (H) 0.2 - 1.0 K/uL   Eosinophils Relative 2 %   Eosinophils Absolute 0.2 0 - 0.7 K/uL   Basophils Relative 0 %   Basophils Absolute 0.1 0 - 0.1 K/uL  Comment: Performed at [facility], [address] [place], [place] [address]  CK     Status: None   Collection Time: 07/09/17  2:04 AM  Result Value Ref Range   Total CK 130 49 - 397 U/L    Comment: Performed at [facility], [address] [place], [place] [address]  Magnesium     Status: Abnormal   Collection Time: 07/09/17  2:04 AM  Result Value Ref Range   Magnesium 1.6 (L) 1.7 - 2.4 mg/dL    Comment: Performed at [facility], [address] [place], [place] [address]  Phosphorus      Status: None   Collection Time: 07/09/17  2:04 AM  Result Value Ref Range   Phosphorus 4.0 2.5 - 4.6 mg/dL    Comment: Performed at [facility], [address] [place], [place] [address]  Lactic acid, plasma     Status: Abnormal   Collection Time: 07/09/17  2:54 AM  Result Value Ref Range   Lactic Acid, Venous 2.4 (HH) 0.5 - 1.9 mmol/L    Comment: CRITICAL RESULT CALLED TO, READ BACK BY AND VERIFIED WITH BUTCH WOODS AT 0335 ON 07/09/17 [place]. Performed at [facility], [place], [place] [address]   Glucose, capillary     Status: Abnormal   Collection Time: 07/09/17  4:18 AM  Result Value Ref Range   Glucose-Capillary 148 (H) 70 - 99 mg/dL  MRSA PCR Screening     Status: None   Collection Time: 07/09/17  4:29 AM  Result Value Ref Range   MRSA by PCR NEGATIVE NEGATIVE    Comment:        The GeneXpert MRSA Assay (FDA approved for NASAL specimens only), is one component of a comprehensive MRSA colonization surveillance program. It is not intended to diagnose MRSA infection nor to guide or monitor treatment for MRSA infections. Performed at [facility], [address] [place], [place] [address]   Triglycerides     Status: Abnormal   Collection Time: 07/09/17  4:46 AM  Result Value Ref Range   Triglycerides 152 (H) <150 mg/dL    Comment: Performed at [facility], [place], [place] [address]  Lactic acid, plasma     Status: Abnormal   Collection Time: 07/09/17  4:52 AM  Result Value Ref Range   Lactic Acid, Venous 3.6 (HH) 0.5 - 1.9 mmol/L    Comment: CRITICAL RESULT CALLED TO, READ BACK BY AND VERIFIED WITH TESS THOMAS ON 07/09/17 AT [place] Performed at [facility], [address] [place], [place] [address]     Current Facility-Administered Medications  Medication Dose Route Frequency Provider Last Rate Last Dose  . 0.9 %  sodium chloride infusion  250 mL Intravenous PRN Arta Silence, MD      . cyproheptadine (PERIACTIN) 4 MG tablet 2 mg  2 mg Oral Q2H Arta Silence, MD   2 mg at 07/09/17 0930  . cyproheptadine (PERIACTIN) 4 MG tablet 4 mg  4 mg Oral Once Arta Silence, MD      . lactated ringers 1,000 mL with potassium chloride 20 mEq infusion   Intravenous Continuous Kasa, Kurian, MD      . LORazepam (ATIVAN) injection 1-2 mg  1-2 mg Intravenous Q4H PRN Awilda Bill, NP      . norepinephrine (LEVOPHED) 28m in D5W 2513mpremix infusion  0-40 mcg/min Intravenous Continuous BlAwilda BillNP  Stopped at 07/09/17 1143  . sodium chloride 0.9 % bolus 1,000 mL  1,000 mL Intravenous Once Flora Lipps, MD        Musculoskeletal: Strength & Muscle Tone: spastic Gait & Station: unable to stand Patient leans: N/A  Psychiatric Specialty Exam: Physical Exam  Nursing note and vitals reviewed. Constitutional: He appears well-developed and well-nourished.  HENT:  Head: Normocephalic and atraumatic.  Eyes: Pupils are equal, round, and reactive to light. Conjunctivae are normal.  Neck: Normal range of motion.  Cardiovascular: Normal heart sounds.  Respiratory: Effort normal.  GI: Soft.  Musculoskeletal: Normal range of motion.       Arms: Neurological: He is alert.  Skin: Skin is warm and dry.  Psychiatric: His mood appears anxious. He is agitated. He is noncommunicative.    Review of Systems  Unable to perform ROS: Mental status change    Blood pressure 106/63, pulse (!) 117, temperature 99.1 F (37.3 C), temperature source Axillary, resp. rate (!) 24, height 6' (1.829 m), weight 68 kg (150 lb), SpO2 100 %.Body mass index is 20.34 kg/m.  General Appearance: Casual  Eye Contact:  Minimal  Speech:  Garbled  Volume:  Decreased  Mood:  Negative  Affect:  Constricted  Thought Process:  Disorganized  Orientation:  Negative  Thought Content:  Negative  Suicidal Thoughts:  Unknown.  Really no idea what could have been his intention when he took  these pills.  It sounds like it was mostly an impulsive behavior  Homicidal Thoughts:  No  Memory:  Negative  Judgement:  Negative  Insight:  Negative  Psychomotor Activity:  Restlessness  Concentration:  Concentration: Poor  Recall:  Poor  Fund of Knowledge:  Poor  Language:  Negative  Akathisia:  Negative  Handed:  Right  AIMS (if indicated):     Assets:  Housing Social Support  ADL's:  Impaired  Cognition:  Impaired,  Severe  Sleep:        Treatment Plan Summary: Daily contact with patient to assess and evaluate symptoms and progress in treatment, Medication management and Plan 32 year old man who took an impulsive overdose of what seems to have been a large number of pills but the wife is certain that they were all antidepressant medicine.  As far as we can tell he did not take any antipsychotic medicine.  Patient is currently delirious spastic agitated.  Differential diagnosis of current condition includes the direct effects of the medicines that he consumed.  Appropriate on increases the risk of seizures and although the EEG was negative could also be contributing to his agitation and fidgetiness.  Serotonin syndrome was considered by the treatment team.  I think that is still a real consideration.  Serotonin syndrome can have a protean presentation with no single factor being absolutely pathognomonic although the spasticity and hypertonicity he showing would be typical.  I think it is also possible there is a contribution from alcohol withdrawal.  Could possibly be having some degree of delirium tremens.  Additionally is possible he could have taken something else that we do not know about.  I have put in an order to have the urine drug screen done.  As far as treatment right now I think probably benzodiazepines are going to be the most likely thing to keep him calm and relaxed.  I do not think it is probably necessary to go back to the cyproheptadine.  He seems to have gotten a little  bit better from the Valium that he  had just gotten when I saw him a second time.  Continue the IVC and continue a one-to-one sitter for his safety.  I will continue to follow up regularly.  Disposition: Could not really discussed the plan with the patient and I do not know whether he needs hospitalization until he wakes up and can be properly interviewed.  Alethia Berthold, MD 07/09/2017 3:13 PM

## 2017-07-09 NOTE — Progress Notes (Signed)
eeg completed ° °

## 2017-07-09 NOTE — Consult Note (Signed)
 Name: Eric Davila MRN: [id number] DOB: 04/30/1985    ADMISSION DATE:  07/09/2017 CONSULTATION DATE: 07/09/2017  REFERRING MD : Dr. Sridharan   CHIEF COMPLAINT: Intentional Drug Overdose   BRIEF PATIENT DESCRIPTION:  32 yo male admitted following intentional drug overdose requiring mechanical intubation for airway protection   SIGNIFICANT EVENTS/STUDIES:  06/26 Pt admitted to ICU mechanically intubated  HISTORY OF PRESENT ILLNESS:   This is a 32 yo male with a PMH of Depression and Anxiety.  He presented to [facility] ER on 06/26 via EMS following an intentional drug overdose.  Per H&P the pt was found down with pill bottles on the floor.  It is estimated he took 525 mg of Effexor and 900 mg of Cymbalta, these were his wife's medications. En route to the ER he had seizure-like activity, therefore he received versed.  Upon arrival to the ER he was agitated and combative he received 4 mg iv ativan.  However, he remained tachycardic hr 120's with agitation and clonus requiring mechanical intubation for airway protection.  Lab results revealed lactic acid 2.4, alcohol ethyl <10, acetaminophen level <10, and salicylate level <7.0.  The pt is involuntarily committed. He was subsequently admitted to ICU by hospitalist team for further workup and treatment.    PAST MEDICAL HISTORY :   has no past medical history on file.  has no past surgical history on file. Prior to Admission medications   Not on File   Not on File  FAMILY HISTORY:  family history is not on file. SOCIAL HISTORY:    REVIEW OF SYSTEMS:   Unable to assess pt intubated   SUBJECTIVE:  Unable to assess pt intubated  VITAL SIGNS: Pulse Rate:  [102-123] 102 (06/26 0230) Resp:  [16-22] 20 (06/26 0230) BP: (80-92)/(46-66) 80/50 (06/26 0230) SpO2:  [97 %-100 %] 98 % (06/26 0230) FiO2 (%):  [40 %] 40 % (06/26 0205) Weight:  [68 kg (150 lb)] 68 kg (150 lb) (06/26 0205)  PHYSICAL EXAMINATION: General: well developed, well  nourished male, NAD mechanically intubated  Neuro: not following commands, bilateral pupils dilated 4 mm sluggish  HEENT: supple, no JVD Cardiovascular: sinus tach, no R/G  Lungs: faint rhonchi throughout, even, non labored  Abdomen: +BS x4, soft, non tender, on distended  Musculoskeletal: normal bulk and tone, no edema  Skin: intact no rashes or lesions   Recent Labs  Lab 07/09/17 0204  NA 140  K 3.5  CL 109  CO2 23  BUN 10  CREATININE 1.19  GLUCOSE 87   Recent Labs  Lab 07/09/17 0204  HGB 15.7  HCT 46.9  WBC 15.2*  PLT 186   Dg Abdomen 1 View  Result Date: 07/09/2017 CLINICAL DATA:  32 y/o  M; post intubation. EXAM: ABDOMEN - 1 VIEW COMPARISON:  None. FINDINGS: Normal bowel gas pattern. Enteric tube tip projects over the gastroesophageal junction, advancement recommended. Mild levocurvature of the lumbar spine. No acute osseous abnormality is evident. IMPRESSION: Enteric tube tip projects over gastroesophageal junction, advancement recommended. Electronically Signed   By: Lance  Furusawa-Stratton M.D.   On: 07/09/2017 02:42   Dg Chest Port 1 View  Result Date: 07/09/2017 CLINICAL DATA:  32 y/o  M; post intubation. EXAM: PORTABLE CHEST 1 VIEW COMPARISON:  None. FINDINGS: Endotracheal tube tip projects 4.4 cm above the carina. Enteric tube tip extends below the field of view. Clear lungs. No pneumothorax or pleural effusion. Bones are unremarkable. IMPRESSION: 1. Endotracheal tube tip 4.4 cm above carina. 2. Clear   lungs. Electronically Signed   By: Kristine Garbe M.D.   On: 07/09/2017 02:40    ASSESSMENT / PLAN: Intentional Drug Overdose  Mechanical Intubation Questionable Seizure activity Hypomagnesia  Hx: Anxiety and Depression  P: Full vent support for now-vent settings reviewed and established  SBT once all parameters met VAP bundle implemented  Continuous telemetry monitoring  Maintain map >65 Trend BMP Replace electrolytes as indicated  Monitor UOP    VTE px: subq lovenox  Trend CBC  Monitor for s/sx of bleeding and transfuse for hgb <7 Trend WBC and monitor fever curve  SUP px: IV pepcid  If pt remains intubated in the next 24 hrs will start TF's  Urine drug screen pending  Maintain RASS goal 0 to -1 Propofol gtt and prn fentanyl to maintain RASS goal  WUA daily  Will need sitter at bedside and psychiatry consult once extubated EEG and CT Head pending  Poison control assisting with plan of care   Marda Stalker, [place] Pager [phone] (please enter 7 digits) PCCM Consult Pager [phone] (please enter 7 digits)

## 2017-07-09 NOTE — Progress Notes (Signed)
Patient extubated successfully, however, has severe agitation along with bleeding from foley and IV sites. Patient was given Valium and started on precedex infusion and patient still agitated.  Will give Geodon IM and assess agitation Will also get repeat Labs     Lucie LeatherKurian Halvor William Schake, M.D.  [facility]  Medical Director [facility] Medical Director [facility]

## 2017-07-09 NOTE — ED Provider Notes (Signed)
[facility] Emergency Department Provider Note  ____________________________________________   First MD Initiated Contact with Patient 07/09/17 0200     (approximate)  I have reviewed the triage vital signs and the nursing notes.   HISTORY  Chief Complaint Drug Overdose  Level 5 exemption history limited by the patient's clinical condition  HPI Eric Davila is a 32 y.o. male who is brought to the emergency department via EMS after a suicide attempt.  Apparently the patient has been depressed recently and took an unknown amount of his wife's Effexor and Cymbalta.  When EMS arrived the patient was agitated and delirious  and he was given 5 mg of intramuscular medazepam with some improvement in his symptoms.  Further history is limited by the patient's clinical condition.   History reviewed. No pertinent past medical history.  Patient Active Problem List   Diagnosis Date Noted  . Antidepressant overdose 07/09/2017  . Overdose 07/09/2017    History reviewed. No pertinent surgical history.  Prior to Admission medications   Not on File    Allergies Patient has no allergy information on record.  History reviewed. No pertinent family history.  Social History Social History   Tobacco Use  . Smoking status: Not on file  Substance Use Topics  . Alcohol use: Not on file  . Drug use: Not on file    Review of Systems Level 5 exemption history limited by the patient's clinical condition ____________________________________________   PHYSICAL EXAM:  VITAL SIGNS: ED Triage Vitals  Enc Vitals Group     BP      Pulse      Resp      Temp      Temp src      SpO2      Weight      Height      Head Circumference      Peak Flow      Pain Score      Pain Loc      Pain Edu?      Excl. in [place]?     Constitutional: Diaphoretic.  Delirious.  Appears critically ill Eyes: PERRL EOMI. dilated and brisk Head: Atraumatic. Nose: No  congestion/rhinnorhea. Mouth/Throat: No trismus Neck: No stridor.   Cardiovascular: Tachycardic rate, regular rhythm. Grossly normal heart sounds.  Good peripheral circulation. Respiratory: Increased respiratory effort.  Significant retractions. Lungs CTAB and moving good air Gastrointestinal: Soft nontender Musculoskeletal: No lower extremity edema   Neurologic: Spontaneous clonus with 4+ DTRs bilateral lower extremities in 10-15 beats of ankle clonus bilaterally moving all 4 extremities Skin: Diaphoretic Psychiatric: Obtunded and delirious    ____________________________________________   DIFFERENTIAL includes but not limited to  Anticholinergic toxidrome, excited delirium, serotonin toxicity, sepsis, dehydration, rhabdomyolysis, intracerebral hemorrhage ____________________________________________   LABS (all labs ordered are listed, but only abnormal results are displayed)  Labs Reviewed  ACETAMINOPHEN LEVEL - Abnormal; Notable for the following components:      Result Value   Acetaminophen (Tylenol), Serum <10 (*)    All other components within normal limits  COMPREHENSIVE METABOLIC PANEL - Abnormal; Notable for the following components:   Calcium 8.0 (*)    Total Protein 6.1 (*)    All other components within normal limits  CBC WITH DIFFERENTIAL/PLATELET - Abnormal; Notable for the following components:   WBC 15.2 (*)    Neutro Abs 12.4 (*)    Monocytes Absolute 1.3 (*)    All other components within normal limits  LACTIC ACID, PLASMA -  Abnormal; Notable for the following components:   Lactic Acid, Venous 2.4 (*)    All other components within normal limits  LACTIC ACID, PLASMA - Abnormal; Notable for the following components:   Lactic Acid, Venous 3.6 (*)    All other components within normal limits  BLOOD GAS, ARTERIAL - Abnormal; Notable for the following components:   pO2, Arterial 158 (*)    All other components within normal limits  MAGNESIUM - Abnormal;  Notable for the following components:   Magnesium 1.6 (*)    All other components within normal limits  GLUCOSE, CAPILLARY - Abnormal; Notable for the following components:   Glucose-Capillary 148 (*)    All other components within normal limits  TRIGLYCERIDES - Abnormal; Notable for the following components:   Triglycerides 152 (*)    All other components within normal limits  MRSA PCR SCREENING  ETHANOL  SALICYLATE LEVEL  CK  PHOSPHORUS  URINALYSIS, COMPLETE (UACMP) WITH MICROSCOPIC  URINE DRUG SCREEN, QUALITATIVE ([facility] ONLY)  CALCIUM, IONIZED  HIV ANTIBODY (ROUTINE TESTING)    Lab work reviewed by me with elevated white count which is nonspecific.  Elevated lactic acid likely secondary to under resuscitation __________________________________________  EKG  ED ECG REPORT I, Darel Hong, the attending physician, personally viewed and interpreted this ECG.  Date: 07/09/2017 EKG Time:  Rate: 107 Rhythm: Sinus tachycardia QRS Axis: Rightward axis Intervals: Prolonged QTC ST/T Wave abnormalities: normal Narrative Interpretation: no evidence of acute ischemia.  Slightly prolonged QRS concerning for sodium channel blockade  ____________________________________________  RADIOLOGY  Head CT reviewed by me with no acute disease Chest x-ray reviewed by me with ET tube in adequate position ____________________________________________   PROCEDURES  Procedure(s) performed: yes  Angiocath insertion Performed by: Darel Hong  Consent: Verbal consent obtained. Risks and benefits: risks, benefits and alternatives were discussed Time out: Immediately prior to procedure a "time out" was called to verify the correct patient, procedure, equipment, support staff and site/side marked as required.  Preparation: Patient was prepped and draped in the usual sterile fashion.  Vein Location: Right external jugular vein   Gauge: 18  Normal blood return and flush without  difficulty Patient tolerance: Patient tolerated the procedure well with no immediate complications.   Angiocath insertion Performed by: Darel Hong  Consent: Verbal consent obtained. Risks and benefits: risks, benefits and alternatives were discussed Time out: Immediately prior to procedure a "time out" was called to verify the correct patient, procedure, equipment, support staff and site/side marked as required.  Preparation: Patient was prepped and draped in the usual sterile fashion.  Vein Location: Right upper extremity  Gauge: 16  Normal blood return and flush without difficulty Patient tolerance: Patient tolerated the procedure well with no immediate complications.     .Critical Care Performed by: Darel Hong, MD Authorized by: Darel Hong, MD   Critical care provider statement:    Critical care time (minutes):  40   Critical care time was exclusive of:  Separately billable procedures and treating other patients   Critical care was necessary to treat or prevent imminent or life-threatening deterioration of the following conditions:  Toxidrome   Critical care was time spent personally by me on the following activities:  Development of treatment plan with patient or surrogate, discussions with consultants, evaluation of patient's response to treatment, examination of patient, obtaining history from patient or surrogate, ordering and performing treatments and interventions, ordering and review of laboratory studies, ordering and review of radiographic studies, pulse oximetry, re-evaluation of  patient's condition and review of old charts Procedure Name: Intubation Date/Time: 07/09/2017 2:10 AM Performed by: Darel Hong, MD Pre-anesthesia Checklist: Patient identified, Emergency Drugs available, Suction available and Patient being monitored Oxygen Delivery Method: Non-rebreather mask Preoxygenation: Pre-oxygenation with 100% oxygen Induction Type: IV induction and  Rapid sequence Laryngoscope Size: Mac and 4 Grade View: Grade I Tube size: 8.0 mm Number of attempts: 1 Placement Confirmation: ETT inserted through vocal cords under direct vision,  CO2 detector and Breath sounds checked- equal and bilateral Secured at: 24 cm Tube secured with: ETT holder Dental Injury: Teeth and Oropharynx as per pre-operative assessment        Critical Care performed: Yes  ____________________________________________   INITIAL IMPRESSION / ASSESSMENT AND PLAN / ED COURSE  Pertinent labs & imaging results that were available during my care of the patient were reviewed by me and considered in my medical decision making (see chart for details).   The patient arrives in the emergency department delirious and critically ill.  He is tachycardic with dilated pupils behaving in a bizarre fashion with spontaneous clonus and 4+ deep tendon flexes in his bilateral lower extremities.  He has 10-15 beats of clonus in each ankle as well.  I initially gave him 4 mg of lorazepam however he persisted with bizarre agitated behavior.  Given my high clinical suspicion for serotonin syndrome versus excited delirium versus status epilepticus decision was made to intubate.  Ketamine and rocuronium were used and he was intubated without difficulty.  The patient was slightly hypotensive prior to intubation and remained so thereafter.  I discussed the case with [facility] control who indicated both of these agents could cause hypotension, seizures, and sodium channel blockade and QRS widening.  Placed on norepinephrine for blood pressure along with aggressive fluid resuscitation.  Given 50 g of activated charcoal to hopefully help with the ingestion along with 1 amp of sodium bicarbonate given the slightly prolonged QRS at 115.  Of also ordered cyproheptadine for serotonin syndrome.  At this point the patient requires inpatient admission to the intensive care unit for management of his  life-threatening drug overdose.  I discussed with the hospitalist who has graciously agreed to admit the patient to his service.     ----------------------------------------- 3:12 AM on 07/09/2017 -----------------------------------------  I was notified by nursing that the patient has blood around his meatus and no urine output so far.  I performed a bedside ultrasound and I am unable to visualize the Foley balloon in the bladder.  He very well may have had a false passage.  We will remove the Foley and replace it.  Nursing made to attempt to place a second Foley catheter with no urine returned.  I advised him to stop at this point given my concern for possible false passage.  I discussed with the hospitalist who verbalizes understanding and will place a urology consultation for the morning. ____________________________________________   FINAL CLINICAL IMPRESSION(S) / ED DIAGNOSES  Final diagnoses:  Intentional drug overdose, initial encounter ([place])  Serotonin neurotoxicity, intentional self-harm, initial encounter ([place])  Delirium      NEW MEDICATIONS STARTED DURING THIS VISIT:  There are no discharge medications for this patient.    Note:  This document was prepared using Dragon voice recognition software and may include unintentional dictation errors.     Darel Hong, MD 07/09/17 [phone]

## 2017-07-09 NOTE — Progress Notes (Signed)
Pt extubated per Dr Belia HemanKasa order. Pt placed on 2L [place]

## 2017-07-09 NOTE — Consult Note (Signed)
Urology Consult  I have been asked to see the patient by Dr. Belia HemanKasa, for evaluation and management of Foley trauma.  Chief Complaint: hematuria, urinary retention  History of Present Illness: Eric BrunnerDavid Davila is a 32 y.o. year old admitted following overdose of antidepression medication currently in the ICU after being intubated for airway protection earlier today now extubated.  Foley was placed at some point early this morning with minimal urine output and large amount of blood.  This was ultimately removed.  A condom cath was placed earlier today.  He was voiding small amounts spontaneously but bladder scan this afternoon showed greater than 800 cc in his bladder.  He is currently quite agitated, writhing, in restraints, pulling at IVs.  He is unable to provide any additional history today.  History reviewed. No pertinent past medical history.  History reviewed. No pertinent surgical history.  Home Medications:  No outpatient medications have been marked as taking for the 07/09/17 encounter [facility](Hospital Encounter).    Allergies: Not on File  History reviewed. No pertinent family history.  Social History:  reports that he drank alcohol. He reports that he has current or past drug history. His tobacco history is not on file.  ROS: Given patient's mental state, unable to obtain.  Physical Exam:  Vital signs in last 24 hours: Temp:  [95.7 F (35.4 C)-99.1 F (37.3 C)] 98.4 F (36.9 C) (06/26 1700) Pulse Rate:  [101-123] 103 (06/26 1800) Resp:  [15-32] 27 (06/26 1800) BP: (74-115)/(45-66) 91/58 (06/26 1800) SpO2:  [85 %-100 %] 87 % (06/26 1800) FiO2 (%):  [21 %-40 %] 28 % (06/26 0802) Weight:  [150 lb (68 kg)] 150 lb (68 kg) (06/26 0205) Constitutional: Writhing, agitated, sitter at bedside HEENT:  AT, moist mucus membranes.  Trachea midline, no masses Respiratory: Normal respiratory effort, lungs clear bilaterally GI: Abdomen is soft, nontender, nondistended, no abdominal  masses GU: Circumcised phallus with copious amount of blood at the meatus.  Bilateral descended testicles. Skin: No rashes, bruises or suspicious lesion. Neurologic: Grossly intact, no focal deficits, moving all 4 extremities Psychiatric: Agitated   Laboratory Data:  Recent Labs    07/09/17 0204 07/09/17 1718  WBC 15.2* 18.1*  HGB 15.7 13.9  HCT 46.9 41.9   Recent Labs    07/09/17 0204 07/09/17 1718  NA 140 140  K 3.5 3.8  CL 109 112*  CO2 23 20*  GLUCOSE 87 97  BUN 10 11  CREATININE 1.19 0.89  CALCIUM 8.0* 7.5*   Recent Labs    07/09/17 1718  INR 1.13   No results for input(s): LABURIN in the last 72 hours. Results for orders placed or performed during the hospital encounter of 07/09/17  MRSA PCR Screening     Status: None   Collection Time: 07/09/17  4:29 AM  Result Value Ref Range Status   MRSA by PCR NEGATIVE NEGATIVE Final    Comment:        The GeneXpert MRSA Assay (FDA approved for NASAL specimens only), is one component of a comprehensive MRSA colonization surveillance program. It is not intended to diagnose MRSA infection nor to guide or monitor treatment for MRSA infections. Performed at [facility], [address] [place], [place] [address]      Radiologic Imaging: Dg Abd 1 View  Result Date: 07/09/2017 CLINICAL DATA:  32 year old male status post NGT placement. EXAM: ABDOMEN - 1 VIEW COMPARISON:  Abdominal radiograph dated 07/09/2017 FINDINGS: An enteric tube is partially visualized  with side-port just distal to the gastroesophageal junction and tip in the body of the stomach. Recommend further advancing of the tube for optimal positioning. No dilatation of small bowel. Air is noted in the colon. No free air. IMPRESSION: Enteric tube with tip in the gastric body. Electronically Signed   By: Elgie Collard M.D.   On: 07/09/2017 06:13   Dg Abdomen 1 View  Result Date: 07/09/2017 CLINICAL DATA:  32 y/o  M; post intubation. EXAM:  ABDOMEN - 1 VIEW COMPARISON:  None. FINDINGS: Normal bowel gas pattern. Enteric tube tip projects over the gastroesophageal junction, advancement recommended. Mild levocurvature of the lumbar spine. No acute osseous abnormality is evident. IMPRESSION: Enteric tube tip projects over gastroesophageal junction, advancement recommended. Electronically Signed   By: Mitzi Hansen M.D.   On: 07/09/2017 02:42   Ct Head Wo Contrast  Result Date: 07/09/2017 CLINICAL DATA:  32 y/o M; altered level of consciousness. Overdose. EXAM: CT HEAD WITHOUT CONTRAST TECHNIQUE: Contiguous axial images were obtained from the base of the skull through the vertex without intravenous contrast. COMPARISON:  None. FINDINGS: Brain: No evidence of acute infarction, hemorrhage, hydrocephalus, extra-axial collection or mass lesion/mass effect. Vascular: No hyperdense vessel or unexpected calcification. Skull: Normal. Negative for fracture or focal lesion. Sinuses/Orbits: Mild maxillary sinus mucosal thickening and partial opacification of the sphenoid sinus with fluid level, probably due to intubation. Normal aeration of mastoid air cells. Orbits are unremarkable. Other: None. IMPRESSION: Negative CT of the head. No acute intracranial abnormality identified. Electronically Signed   By: Mitzi Hansen M.D.   On: 07/09/2017 04:27   Dg Chest Port 1 View  Result Date: 07/09/2017 CLINICAL DATA:  32 y/o  M; post intubation. EXAM: PORTABLE CHEST 1 VIEW COMPARISON:  None. FINDINGS: Endotracheal tube tip projects 4.4 cm above the carina. Enteric tube tip extends below the field of view. Clear lungs. No pneumothorax or pleural effusion. Bones are unremarkable. IMPRESSION: 1. Endotracheal tube tip 4.4 cm above carina. 2. Clear lungs. Electronically Signed   By: Mitzi Hansen M.D.   On: 07/09/2017 02:40   Procedure: Using standard sterile technique, an 75 French coud tip catheter was advanced per urethra into the  bladder without difficulty.  The balloon was inflated with 10 cc of sterile water.  Both a sitter and a nurse were present for Foley catheter placement helping restraining the patient's hands.  His catheter was secured to his left leg using anchor x2. Greater than 800 cc of clear yellow urine was drained.  Impression/Assessment:  32 year old male in urinary retention with Foley catheter trauma previously status post unconjugated Foley catheter replacement now.  Retention likely secondary to medication overdose and mental status.  Plan:  -Ideally, allow Foley catheter to remain 48 to 72 hours to allow urethra to heal as well as bladder to drain after stretch injury -Even the patient's status, if he is pulling at his catheter, would recommend removing Foley and replace once he is less agitated with intermittent catheterization in the interim -No particular role for Flomax/finasteride as this is not likely secondary to a prostate issue   Urology will sign off, please page with questions or concerns as needed.  07/09/2017, 7:05 PM  Vanna Scotland,  MD

## 2017-07-09 NOTE — ED Triage Notes (Signed)
Pt took unknown amount of wife's medication.

## 2017-07-09 NOTE — ED Notes (Signed)
Dr Lamont Snowballifenbark notified at this time of pt's elevated Lactic Acid level as reported by lab: Lactic Acid 2.24mmol/L

## 2017-07-09 NOTE — ED Notes (Addendum)
Pt brought to the er via ems and [employer] for intentional overdose. Wife called out. Pt took unknown amount of his wife's medications of cymbalta and effexor. Maximum dosage possible is 900mg  of cymbalta and 525mf of effexor XR. Pt pupils are dilated and heart rate is tachy at 120. Pt is confused, combative and is having seizures. EMS gave 5mg  of versed en route. Dr Lamont Snowballifenbark to bedside.

## 2017-07-09 NOTE — Progress Notes (Signed)
A/P: Eric Davila intentional antidepressant overdose (Cymbalta + Effexor), intubated. Leukocytosis, hypocalcemia, hypomagnesemia. -Activated charcoal in ED -Admit to ICU -Continuous cardiac monitoring -Pulse ox -Intubated, Propofol/Fentanyl -Poison control - monitor for hypotension, QTc prolongation, seizures, metabolic/electrolyte abnormalities, Serotonin syndrome -Hypotensive, Levophed -Labs unimpressive thus far -IVF -Cyproheptadine for Serotonin Syndrome -EEG to evaluate for seizure activity -Monitor EKG (QTc) -Supportive care -Ionized calcium pending -Replete Mag -No home meds -NPO -Lovenox -Full code -Admission, > 2 midnights  Plan per above

## 2017-07-10 LAB — BASIC METABOLIC PANEL
ANION GAP: 9 (ref 5–15)
ANION GAP: 9 (ref 5–15)
BUN: 10 mg/dL (ref 6–20)
BUN: 8 mg/dL (ref 6–20)
CHLORIDE: 107 mmol/L (ref 98–111)
CHLORIDE: 106 mmol/L (ref 98–111)
CO2: 22 mmol/L (ref 22–32)
CO2: 22 mmol/L (ref 22–32)
CREATININE: 0.74 mg/dL (ref 0.61–1.24)
Calcium: 8 mg/dL — ABNORMAL LOW (ref 8.9–10.3)
Calcium: 7.9 mg/dL — ABNORMAL LOW (ref 8.9–10.3)
Creatinine, Ser: 0.69 mg/dL (ref 0.61–1.24)
GFR calc Af Amer: >60 mL/min (ref >60)
GFR calc non Af Amer: >60 mL/min (ref >60)
GFR calc non Af Amer: >60 mL/min (ref >60)
Glucose, Bld: 130 mg/dL — ABNORMAL HIGH (ref 70–99)
Glucose, Bld: 114 mg/dL — ABNORMAL HIGH (ref 70–99)
POTASSIUM: 3.8 mmol/L (ref 3.5–5.1)
POTASSIUM: 3.7 mmol/L (ref 3.5–5.1)
SODIUM: 137 mmol/L (ref 135–145)
Sodium: 138 mmol/L (ref 135–145)

## 2017-07-10 LAB — HIV ANTIBODY (ROUTINE TESTING W REFLEX): HIV SCREEN 4TH GENERATION: NONREACTIVE

## 2017-07-10 LAB — CALCIUM, IONIZED: Calcium, Ionized, Serum: 4.3 mg/dL — ABNORMAL LOW (ref 4.5–5.6)

## 2017-07-10 LAB — MAGNESIUM: MAGNESIUM: 1.9 mg/dL (ref 1.7–2.4)

## 2017-07-10 LAB — LACTIC ACID, PLASMA: LACTIC ACID, VENOUS: 1.7 mmol/L (ref 0.5–1.9)

## 2017-07-10 LAB — GLUCOSE, CAPILLARY: Glucose-Capillary: 125 mg/dL — ABNORMAL HIGH (ref 70–99)

## 2017-07-10 LAB — PHOSPHORUS: PHOSPHORUS: 2.5 mg/dL (ref 2.5–4.6)

## 2017-07-10 MED ORDER — IBUPROFEN 400 MG PO TABS
800.0000 mg | ORAL_TABLET | Freq: Three times a day (TID) | ORAL | Status: DC | PRN
  Administered 2017-07-10 – 2017-07-11 (×3): 800 mg via ORAL
  Filled 2017-07-10 (×3): qty 2

## 2017-07-10 MED ORDER — POTASSIUM CHLORIDE 2 MEQ/ML IV SOLN
INTRAVENOUS | Status: DC
  Administered 2017-07-10 – 2017-07-11 (×2): via INTRAVENOUS
  Filled 2017-07-10 (×4): qty 1000

## 2017-07-10 MED ORDER — MAGNESIUM SULFATE 2 GM/50ML IV SOLN
2.0000 g | Freq: Once | INTRAVENOUS | Status: AC
  Administered 2017-07-10: 2 g via INTRAVENOUS
  Filled 2017-07-10: qty 50

## 2017-07-10 MED ORDER — PHENOL 1.4 % MT LIQD
1.0000 | OROMUCOSAL | Status: DC | PRN
  Administered 2017-07-10 – 2017-07-11 (×2): 1 via OROMUCOSAL
  Filled 2017-07-10: qty 177

## 2017-07-10 NOTE — Plan of Care (Signed)
Pt denies pain, turns himself ad lib in bed

## 2017-07-10 NOTE — Progress Notes (Signed)
Pt more alert and cooperative this afternoon, precedex gtt infusing at .2 mcg.  Geodon x 1 this shift, appears to work well for him.  Wife visited in room.  Patient's mother called Clinical research associatewriter while his wife was in room.  Mother stated "This is his wife's fault.  I heard they had some kind of suicide pact, and she backed out but he didn't." Writer instructed 1:1 sitter in room to not allow wife to pass anything to the patient and watch her carefully.  Sitter did so but wife was not observed doing anything out of the ordinary.

## 2017-07-10 NOTE — Progress Notes (Signed)
Initial Nutrition Assessment  DOCUMENTATION CODES:   Not applicable  INTERVENTION:  Will monitor for diet advancement.  Once diet advanced patient may benefit from oral nutrition supplement to help meet calorie/protein needs until appetite returns to normal.  If diet unable to be advanced by 7/3-7/6, will need to consider initiation of nutrition support at that time.  NUTRITION DIAGNOSIS:   Inadequate oral intake related to inability to eat as evidenced by NPO status.  GOAL:   Provide needs based on ASPEN/SCCM guidelines  MONITOR:   Labs, Weight trends, I & O's, Other (Comment)(O2 device)  REASON FOR ASSESSMENT:   Other (Comment)(Low BMI)    ASSESSMENT:   32 year old male with no significant PMHx admitted following intentional drug overdose requiring mechanical intubation for airway protection s/p extubation on 6/26 complicated by severe agitation, delirium, and acute urinary retention.   At time of RD assessment this AM patient was on BiPAP and had safety sitter at bedside and safety mitts on. He was confused and unable to provide any history. No family members available. Very limited information on patient in chart. It appears he [facility] for migraines, anxiety, and depression. 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 Unable to assess mouth due to BiPAP use.   Limited weight history in chart. He was 126.2 lbs on 05/16/2014 and 126.8 lbs on 10/04/2015.  Medications reviewed and include: Ativan 2 mg Q6hrs IV, Precedex gtt, LR with KCl 40 mEq at 75 mL/hr.  Labs reviewed: CBG 125. Potassium, Phosphorus, and Magnesium all WNL.  Patient does not meet criteria for malnutrition at this time. He is likely not underweight. RD suspects height of 6' was estimated on admission and not measured. He has previously had a recorded height of 5\' 8"  which would mean he is not  truly underweight for his height.  Discussed with RN and on rounds. Plan is to wean BiPAP today and come down on Precedex gtt rate.  NUTRITION - FOCUSED PHYSICAL EXAM:   Most Recent Value  Orbital Region  Unable to assess  Upper Arm Region  No depletion  Thoracic and Lumbar Region  No depletion  Buccal Region  Unable to assess  Temple Region  No depletion  Clavicle Bone Region  Mild depletion  Clavicle and Acromion Bone Region  Mild depletion  Scapular Bone Region  Unable to assess  Dorsal Hand  No depletion  Patellar Region  Moderate depletion  Anterior Thigh Region  Moderate depletion  Posterior Calf Region  Moderate depletion  Edema (RD Assessment)  None  Hair  Reviewed  Eyes  Unable to assess  Mouth  Unable to assess  Skin  Reviewed  Nails  Reviewed      Diet Order:   Diet Order           Diet NPO time specified  Diet effective now          EDUCATION NEEDS:   Not appropriate for education at this time  Skin:  Skin Assessment: Reviewed RN Assessment  Last BM:  Unknown/PTA  Height:   Ht Readings from Last 1 Encounters:  07/10/17 6' (1.829 m)  Unsure if 6' is accurate as per documentation in Care Everywhere he was 5\' 8"   Weight:   Wt Readings from Last 1 Encounters:  07/10/17 135 lb 9.3 oz (61.5 kg)    Ideal Body Weight:  80.9 kg  BMI:  Body mass index is 18.39 kg/m.  Estimated Nutritional Needs:   Kcal:  1925-2250 (MSJ x 1.2-1.4)  Protein:  90-105 grams (1.5-1.7 grams/kg)  Fluid:  1.8-2.1 L/day (30-35 mL/kg)  Helane Rima, MS, RD, LDN Office: [phone] Pager: [phone] After Hours/Weekend Pager: [phone]

## 2017-07-10 NOTE — Progress Notes (Signed)
[facility] [place] at [facility]   PATIENT NAME: Eric BrunnerDavid Davila    MR#:  [id number]  DATE OF BIRTH:  04/11/1985  SUBJECTIVE:  Extubated, on precedex gtt, sitter in the room. On BIPAP Has been restless  REVIEW OF SYSTEMS:   Review of Systems  Unable to perform ROS: Medical condition     DRUG ALLERGIES:   Allergies  Allergen Reactions  . Latex Rash  . Tape Rash    VITALS:  Blood pressure 112/70, pulse 96, temperature 98.1 F (36.7 C), temperature source Oral, resp. rate (!) 22, height 6' (1.829 m), weight 61.5 kg (135 lb 9.3 oz), SpO2 98 %.  PHYSICAL EXAMINATION:   Physical Exam  GENERAL:  32 y.o.-year-old patient lying in the bed with no acute distress. restless EYES: Pupils equal, round, reactive to light and accommodation. No scleral icterus. Extraocular muscles intact.  HEENT: Head atraumatic, normocephalic. Oropharynx and nasopharynx clear. BIPAP NECK:  Supple, no jugular venous distention. No thyroid enlargement, no tenderness.  LUNGS: Normal breath sounds bilaterally, no wheezing, rales, rhonchi. No use of accessory muscles of respiration.  CARDIOVASCULAR: S1, S2 normal. No murmurs, rubs, or gallops.  ABDOMEN: Soft, nontender, nondistended. Bowel sounds present. No organomegaly or mass.  EXTREMITIES: No cyanosis, clubbing or edema b/l.    NEUROLOGIC: moves all extremties well PSYCHIATRIC:  patient is alert but restless SKIN: No obvious rash, lesion, or ulcer.   LABORATORY PANEL:  CBC Recent Labs  Lab 07/09/17 1718  WBC 18.1*  HGB 13.9  HCT 41.9  PLT 180    Chemistries  Recent Labs  Lab 07/09/17 0204  07/10/17 0538 07/10/17 1245  NA 140   < > 138 137  K 3.5   < > 3.8 3.7  CL 109   < > 107 106  CO2 23   < > 22 22  GLUCOSE 87   < > 130* 114*  BUN 10   < > 10 8  CREATININE 1.19   < > 0.69 0.74  CALCIUM 8.0*   < > 8.0* 7.9*  MG 1.6*  --  1.9  --   AST 26  --   --   --   ALT 12  --   --   --   ALKPHOS 63  --   --   --    BILITOT 0.3  --   --   --    < > = values in this interval not displayed.   Cardiac Enzymes No results for input(s): TROPONINI in the last 168 hours. RADIOLOGY:  Dg Abd 1 View  Result Date: 07/09/2017 CLINICAL DATA:  32 year old male status post NGT placement. EXAM: ABDOMEN - 1 VIEW COMPARISON:  Abdominal radiograph dated 07/09/2017 FINDINGS: An enteric tube is partially visualized with side-port just distal to the gastroesophageal junction and tip in the body of the stomach. Recommend further advancing of the tube for optimal positioning. No dilatation of small bowel. Air is noted in the colon. No free air. IMPRESSION: Enteric tube with tip in the gastric body. Electronically Signed   By: Elgie CollardArash  Radparvar M.D.   On: 07/09/2017 06:13   Dg Abdomen 1 View  Result Date: 07/09/2017 CLINICAL DATA:  32 y/o  M; post intubation. EXAM: ABDOMEN - 1 VIEW COMPARISON:  None. FINDINGS: Normal bowel gas pattern. Enteric tube tip projects over the gastroesophageal junction, advancement recommended. Mild levocurvature of the lumbar spine. No acute osseous abnormality is evident. IMPRESSION: Enteric tube tip projects over gastroesophageal junction,  advancement recommended. Electronically Signed   By: Mitzi Hansen M.D.   On: 07/09/2017 02:42   Ct Head Wo Contrast  Result Date: 07/09/2017 CLINICAL DATA:  32 y/o M; altered level of consciousness. Overdose. EXAM: CT HEAD WITHOUT CONTRAST TECHNIQUE: Contiguous axial images were obtained from the base of the skull through the vertex without intravenous contrast. COMPARISON:  None. FINDINGS: Brain: No evidence of acute infarction, hemorrhage, hydrocephalus, extra-axial collection or mass lesion/mass effect. Vascular: No hyperdense vessel or unexpected calcification. Skull: Normal. Negative for fracture or focal lesion. Sinuses/Orbits: Mild maxillary sinus mucosal thickening and partial opacification of the sphenoid sinus with fluid level, probably due to  intubation. Normal aeration of mastoid air cells. Orbits are unremarkable. Other: None. IMPRESSION: Negative CT of the head. No acute intracranial abnormality identified. Electronically Signed   By: Mitzi Hansen M.D.   On: 07/09/2017 04:27   Dg Chest Port 1 View  Result Date: 07/09/2017 CLINICAL DATA:  32 y/o  M; post intubation. EXAM: PORTABLE CHEST 1 VIEW COMPARISON:  None. FINDINGS: Endotracheal tube tip projects 4.4 cm above the carina. Enteric tube tip extends below the field of view. Clear lungs. No pneumothorax or pleural effusion. Bones are unremarkable. IMPRESSION: 1. Endotracheal tube tip 4.4 cm above carina. 2. Clear lungs. Electronically Signed   By: Mitzi Hansen M.D.   On: 07/09/2017 02:40   ASSESSMENT AND PLAN:  32 yo male with a PMH of Depression and Anxiety.  He presented to [facility] ER on 06/26 via EMS following an intentional drug overdose.  Per H&P the pt was found down with pill bottles on the floor.  It is estimated he took 525 mg of Effexor and 900 mg of Cymbalta, these were his wife's medications. En route to the ER he had seizure-like activity, therefore he received versed.   * Intentional drug overdose -pt now extubated -Sitter in the room -IV precedex for agitation  *??seziure like activity -EEG neg for true epilepsy  *DVT prophylaxis Lovenox  Case discussed with Care Management/Social Worker. Management plans discussed with the patient, family and they are in agreement.  CODE STATUS: full  DVT Prophylaxis: Lovenox  TOTAL TIME TAKING CARE OF THIS PATIENT: *30* minutes.  >50% time spent on counselling and coordination of care  POSSIBLE D/C IN 1-2 DAYS, DEPENDING ON CLINICAL CONDITION.  Note: This dictation was prepared with Dragon dictation along with smaller phrase technology. Any transcriptional errors that result from this process are unintentional.  Enedina Finner M.D on 07/10/2017 at 5:29 PM  Between 7am to 6pm - Pager -  [phone]  After 6pm go to [email or web address] - password [email or web address] [facility]  [facility]  Office  [phone]  CC: Primary care physician; Patient, No Pcp PerPatient ID: Hilmar Moldovan, male   DOB: 08/28/1985, 32 y.o.   MRN: [id number]

## 2017-07-10 NOTE — Consult Note (Signed)
Name: Eric BrunnerDavid Davila MRN: [id number] DOB: 03/09/1985    ADMISSION DATE:  07/09/2017 CONSULTATION DATE: 07/09/2017  REFERRING MD : Dr. Marjie SkiffSridharan   CHIEF COMPLAINT: Intentional Drug Overdose  Follow up resp failure  BRIEF PATIENT DESCRIPTION:  32 yo male admitted following intentional drug overdose requiring mechanical intubation for airway protection   SIGNIFICANT EVENTS/STUDIES:  06/26 Pt admitted to ICU mechanically intubated  HISTORY OF PRESENT ILLNESS:   Extubated developed severe agitation Placed on biPAP fio2 at 50% Foley placed by urology for acute urinary retention  REVIEW OF SYSTEMS:   Unable to assess patient on biPAP   VITAL SIGNS: Temp:  [97.8 F (36.6 C)-99.1 F (37.3 C)] 98.9 F (37.2 C) (06/27 0400) Pulse Rate:  [80-117] 80 (06/27 0600) Resp:  [15-32] 21 (06/27 0600) BP: (74-116)/(56-84) 116/77 (06/27 0600) SpO2:  [85 %-100 %] 99 % (06/27 0600) FiO2 (%):  [28 %] 28 % (06/26 0802)  PHYSICAL EXAMINATION:  GENERAL:critically ill appearing, +resp distress HEAD: Normocephalic, atraumatic.  EYES: Pupils equal, round, reactive to light.  No scleral icterus.  MOUTH: Moist mucosal membrane. NECK: Supple. No thyromegaly. No nodules. No JVD.  PULMONARY: +rhonchi, +wheezing CARDIOVASCULAR: S1 and S2. Regular rate and rhythm. No murmurs, rubs, or gallops.  GASTROINTESTINAL: Soft, nontender, -distended. No masses. Positive bowel sounds. No hepatosplenomegaly.  MUSCULOSKELETAL: No swelling, clubbing, or edema.  NEUROLOGIC:agitated SKIN:intact,warm,dry    Recent Labs  Lab 07/09/17 0204 07/09/17 1718 07/10/17 0538  NA 140 140 138  K 3.5 3.8 3.8  CL 109 112* 107  CO2 23 20* 22  BUN 10 11 10   CREATININE 1.19 0.89 0.69  GLUCOSE 87 97 130*   Recent Labs  Lab 07/09/17 0204 07/09/17 1718  HGB 15.7 13.9  HCT 46.9 41.9  WBC 15.2* 18.1*  PLT 186 180   Dg Abd 1 View  Result Date: 07/09/2017 CLINICAL DATA:  32 year old male status post NGT placement.  EXAM: ABDOMEN - 1 VIEW COMPARISON:  Abdominal radiograph dated 07/09/2017 FINDINGS: An enteric tube is partially visualized with side-port just distal to the gastroesophageal junction and tip in the body of the stomach. Recommend further advancing of the tube for optimal positioning. No dilatation of small bowel. Air is noted in the colon. No free air. IMPRESSION: Enteric tube with tip in the gastric body. Electronically Signed   By: Elgie CollardArash  Radparvar M.D.   On: 07/09/2017 06:13   Dg Abdomen 1 View  Result Date: 07/09/2017 CLINICAL DATA:  32 y/o  M; post intubation. EXAM: ABDOMEN - 1 VIEW COMPARISON:  None. FINDINGS: Normal bowel gas pattern. Enteric tube tip projects over the gastroesophageal junction, advancement recommended. Mild levocurvature of the lumbar spine. No acute osseous abnormality is evident. IMPRESSION: Enteric tube tip projects over gastroesophageal junction, advancement recommended. Electronically Signed   By: Mitzi HansenLance  Furusawa-Stratton M.D.   On: 07/09/2017 02:42   Ct Head Wo Contrast  Result Date: 07/09/2017 CLINICAL DATA:  32 y/o M; altered level of consciousness. Overdose. EXAM: CT HEAD WITHOUT CONTRAST TECHNIQUE: Contiguous axial images were obtained from the base of the skull through the vertex without intravenous contrast. COMPARISON:  None. FINDINGS: Brain: No evidence of acute infarction, hemorrhage, hydrocephalus, extra-axial collection or mass lesion/mass effect. Vascular: No hyperdense vessel or unexpected calcification. Skull: Normal. Negative for fracture or focal lesion. Sinuses/Orbits: Mild maxillary sinus mucosal thickening and partial opacification of the sphenoid sinus with fluid level, probably due to intubation. Normal aeration of mastoid air cells. Orbits are unremarkable. Other: None. IMPRESSION: Negative CT of the  head. No acute intracranial abnormality identified. Electronically Signed   By: Mitzi Hansen M.D.   On: 07/09/2017 04:27   Dg Chest Port 1  View  Result Date: 07/09/2017 CLINICAL DATA:  32 y/o  M; post intubation. EXAM: PORTABLE CHEST 1 VIEW COMPARISON:  None. FINDINGS: Endotracheal tube tip projects 4.4 cm above the carina. Enteric tube tip extends below the field of view. Clear lungs. No pneumothorax or pleural effusion. Bones are unremarkable. IMPRESSION: 1. Endotracheal tube tip 4.4 cm above carina. 2. Clear lungs. Electronically Signed   By: Mitzi Hansen M.D.   On: 07/09/2017 02:40    ASSESSMENT / PLAN: 32 yo WM with acute Intentional Drug Overdose leading to acute encephalopathy and inability to protect airway s/p extubation complicated by severe agitation and delirium with acute urinary retention   1.Continuous telemetry monitoring  2.wean off biPAP as tolerated-high risk for intubation 3.continue foley catheter 4.precedex and geodon for agitation 5.Replace electrolytes as indicated  6.Monitor UOP  7.VTE px: subq lovenox  8.Urine drug screen +PCP+cannibinoids+Amphetamines  9.follow up psych consult   Critical Care Time devoted to patient care services described in this note is 32 minutes.   Patient remains critically ill, high risk for re-intubation   Lynnzie Blackson Santiago Glad, M.D.  [facility]  Medical Director [facility] Medical Director [facility] Cardio-Pulmonary Department

## 2017-07-10 NOTE — Consult Note (Signed)
[facility] Consult   Reason for Consult: Follow-up consult for 32 year old man with a history of substance abuse and mood and behavior problems who took a large overdose of antidepressants Referring Physician: Posey Pronto Patient Identification: Eric Davila MRN:  [id number] Principal Diagnosis: Acute delirium Diagnosis:   Patient Active Problem List   Diagnosis Date Noted  . Antidepressant overdose [T43.201A] 07/09/2017  . Overdose [T50.901A] 07/09/2017  . Acute delirium [R41.0] 07/09/2017  . 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 male patient admitted with patient not able to give information.  HPI: See previous note.  32 year old man with a history of substance abuse problems took a large overdose of his wife's antidepressant medicine.  Also may have consumed other unknown medicine.  Drug screen was obtained and showed that it was positive for multiple things including not only amphetamine but PCP and marijuana.  Patient remains delirious and in fact had to be put on BiPAP.  Was not able to give any information at all today.  Past Psychiatric History: No known previous psych admissions just a history of substance abuse and anxiety problems  Risk to Self:   Risk to Others:   Prior Inpatient Therapy:   Prior Outpatient Therapy:    Past Medical History: History reviewed. No pertinent past medical history. History reviewed. No pertinent surgical history. Family History: History reviewed. No pertinent family history. Family Psychiatric  History: Unknown Social History:  Social History   Substance and Sexual Activity  Alcohol Use Not Currently     Social History   Substance and Sexual Activity  Drug Use Not Currently    Social History   Socioeconomic History  . Marital status: Married    Spouse name: Not on file  . Number of children: Not on file  . Years of  education: Not on file  . Highest education level: Not on file  Occupational History  . Not on file  Social Needs  . Financial resource strain: Not on file  . Food insecurity:    Worry: Not on file    Inability: Not on file  . Transportation needs:    Medical: Not on file    Non-medical: Not on file  Tobacco Use  . Smoking status: Unknown If Ever Smoked  Substance and Sexual Activity  . Alcohol use: Not Currently  . Drug use: Not Currently  . Sexual activity: Yes    Partners: Female  Lifestyle  . Physical activity:    Days per week: Not on file    Minutes per session: Not on file  . Stress: Not on file  Relationships  . Social connections:    Talks on phone: Not on file    Gets together: Not on file    Attends religious service: Not on file    Active member of club or organization: Not on file    Attends meetings of clubs or organizations: Not on file    Relationship status: Not on file  Other Topics Concern  . Not on file  Social History Narrative  . Not on file   Additional Social History:    Allergies:   Allergies  Allergen Reactions  . Latex Rash  . Tape Rash    Labs:  Results for orders placed or performed during the hospital encounter of 07/09/17 (from the past 48 hour(s))  Blood gas, arterial     Status: Abnormal  Collection Time: 07/09/17  2:02 AM  Result Value Ref Range   FIO2 0.40    Delivery systems VENTILATOR    Mode ASSIST CONTROL    VT 500 mL   LHR 16 resp/min   Peep/cpap 5.0 cm H20   pH, Arterial 7.38 7.350 - 7.450   pCO2 arterial 41 32.0 - 48.0 mmHg   pO2, Arterial 158 (H) 83.0 - 108.0 mmHg   Bicarbonate 24.3 20.0 - 28.0 mmol/L   Acid-base deficit 0.9 0.0 - 2.0 mmol/L   O2 Saturation 99.4 %   Patient temperature 37.0    Collection site LEFT RADIAL    Sample type ARTERIAL DRAW    Allens test (pass/fail) PASS PASS    Comment: Performed at [facility], [address] [place], [place] [address]  Acetaminophen level      Status: Abnormal   Collection Time: 07/09/17  2:04 AM  Result Value Ref Range   Acetaminophen (Tylenol), Serum <10 (L) 10 - 30 ug/mL    Comment: (NOTE) Therapeutic concentrations vary significantly. A range of 10-30 ug/mL  may be an effective concentration for many patients. However, some  are best treated at concentrations outside of this range. Acetaminophen concentrations >150 ug/mL at 4 hours after ingestion  and >50 ug/mL at 12 hours after ingestion are often associated with  toxic reactions. Performed at [facility], [address] [place], [place] [address]   Comprehensive metabolic panel     Status: Abnormal   Collection Time: 07/09/17  2:04 AM  Result Value Ref Range   Sodium 140 135 - 145 mmol/L   Potassium 3.5 3.5 - 5.1 mmol/L   Chloride 109 98 - 111 mmol/L    Comment: Please note change in reference range.   CO2 23 22 - 32 mmol/L   Glucose, Bld 87 70 - 99 mg/dL    Comment: Please note change in reference range.   BUN 10 6 - 20 mg/dL    Comment: Please note change in reference range.   Creatinine, Ser 1.19 0.61 - 1.24 mg/dL   Calcium 8.0 (L) 8.9 - 10.3 mg/dL   Total Protein 6.1 (L) 6.5 - 8.1 g/dL   Albumin 3.6 3.5 - 5.0 g/dL   AST 26 15 - 41 U/L   ALT 12 0 - 44 U/L    Comment: Please note change in reference range.   Alkaline Phosphatase 63 38 - 126 U/L   Total Bilirubin 0.3 0.3 - 1.2 mg/dL   GFR calc non Af Amer >60 >60 mL/min   GFR calc Af Amer >60 >60 mL/min    Comment: (NOTE) The eGFR has been calculated using the CKD EPI equation. This calculation has not been validated in all clinical situations. eGFR's persistently <60 mL/min signify possible Chronic Kidney Disease.    Anion gap 8 5 - 15    Comment: Performed at [facility], [address] [place], [place] [address]  Ethanol     Status: None   Collection Time: 07/09/17  2:04 AM  Result Value Ref Range   Alcohol, Ethyl (B) <10 <10 mg/dL    Comment: (NOTE) Lowest detectable  limit for serum alcohol is 10 mg/dL. For medical purposes only. Performed at [facility], [address] [place], [place] [address]   Salicylate level     Status: None   Collection Time: 07/09/17  2:04 AM  Result Value Ref Range   Salicylate Lvl <7.6 2.8 - 30.0 mg/dL    Comment:  Performed at [facility], [address] [place], [place] [address]  CBC with Differential     Status: Abnormal   Collection Time: 07/09/17  2:04 AM  Result Value Ref Range   WBC 15.2 (H) 3.8 - 10.6 K/uL   RBC 5.02 4.40 - 5.90 MIL/uL   Hemoglobin 15.7 13.0 - 18.0 g/dL   HCT 46.9 40.0 - 52.0 %   MCV 93.5 80.0 - 100.0 fL   MCH 31.2 26.0 - 34.0 pg   MCHC 33.4 32.0 - 36.0 g/dL   RDW 13.8 11.5 - 14.5 %   Platelets 186 150 - 440 K/uL   Neutrophils Relative % 81 %   Neutro Abs 12.4 (H) 1.4 - 6.5 K/uL   Lymphocytes Relative 8 %   Lymphs Abs 1.1 1.0 - 3.6 K/uL   Monocytes Relative 9 %   Monocytes Absolute 1.3 (H) 0.2 - 1.0 K/uL   Eosinophils Relative 2 %   Eosinophils Absolute 0.2 0 - 0.7 K/uL   Basophils Relative 0 %   Basophils Absolute 0.1 0 - 0.1 K/uL    Comment: Performed at [facility], [address] [place], [place] [address]  CK     Status: None   Collection Time: 07/09/17  2:04 AM  Result Value Ref Range   Total CK 130 49 - 397 U/L    Comment: Performed at [facility], [address] [place], [place] [address]  Magnesium     Status: Abnormal   Collection Time: 07/09/17  2:04 AM  Result Value Ref Range   Magnesium 1.6 (L) 1.7 - 2.4 mg/dL    Comment: Performed at [facility], [address] [place], [place] [address]  Phosphorus     Status: None   Collection Time: 07/09/17  2:04 AM  Result Value Ref Range   Phosphorus 4.0 2.5 - 4.6 mg/dL    Comment: Performed at [facility], [place], [place] [address]  Lactic acid, plasma     Status: Abnormal   Collection Time: 07/09/17  2:54 AM  Result Value Ref Range    Lactic Acid, Venous 2.4 (HH) 0.5 - 1.9 mmol/L    Comment: CRITICAL RESULT CALLED TO, READ BACK BY AND VERIFIED WITH BUTCH WOODS AT 0335 ON 07/09/17 [place]. Performed at [facility], [place], [place] [address]   Glucose, capillary     Status: Abnormal   Collection Time: 07/09/17  4:18 AM  Result Value Ref Range   Glucose-Capillary 148 (H) 70 - 99 mg/dL  MRSA PCR Screening     Status: None   Collection Time: 07/09/17  4:29 AM  Result Value Ref Range   MRSA by PCR NEGATIVE NEGATIVE    Comment:        The GeneXpert MRSA Assay (FDA approved for NASAL specimens only), is one component of a comprehensive MRSA colonization surveillance program. It is not intended to diagnose MRSA infection nor to guide or monitor treatment for MRSA infections. Performed at [facility], [address] [place], [place] [address]   Calcium, ionized     Status: Abnormal   Collection Time: 07/09/17  4:46 AM  Result Value Ref Range   Calcium, Ionized, Serum 4.3 (L) 4.5 - 5.6 mg/dL    Comment: (NOTE) Performed At: [facility] [place], [place] [id number] Rush Farmer MD [id number] Performed at [facility], [address], [place], [place] [address]   Triglycerides  Status: Abnormal   Collection Time: 07/09/17  4:46 AM  Result Value Ref Range   Triglycerides 152 (H) <150 mg/dL    Comment: Performed at [facility], [address] [place], [place] [address]  Lactic acid, plasma     Status: Abnormal   Collection Time: 07/09/17  4:52 AM  Result Value Ref Range   Lactic Acid, Venous 3.6 (HH) 0.5 - 1.9 mmol/L    Comment: CRITICAL RESULT CALLED TO, READ BACK BY AND VERIFIED WITH TESS THOMAS ON 07/09/17 AT [place] [facility] Performed at [facility], [address] [place], [place] [address]   HIV antibody (Routine Testing)     Status: None   Collection Time: 07/09/17  4:56 AM  Result Value Ref Range   HIV Screen  4th Generation wRfx Non Reactive Non Reactive    Comment: (NOTE) Performed At: [facility] [place], [place] [id number] Rush Farmer MD [phone] Performed at [facility], [address] [place], [place] [address]   CBC with Differential/Platelet     Status: Abnormal   Collection Time: 07/09/17  5:18 PM  Result Value Ref Range   WBC 18.1 (H) 3.8 - 10.6 K/uL   RBC 4.53 4.40 - 5.90 MIL/uL   Hemoglobin 13.9 13.0 - 18.0 g/dL   HCT 41.9 40.0 - 52.0 %   MCV 92.6 80.0 - 100.0 fL   MCH 30.7 26.0 - 34.0 pg   MCHC 33.2 32.0 - 36.0 g/dL   RDW 13.4 11.5 - 14.5 %   Platelets 180 150 - 440 K/uL   Neutrophils Relative % 86 %   Neutro Abs 15.6 (H) 1.4 - 6.5 K/uL   Lymphocytes Relative 5 %   Lymphs Abs 0.9 (L) 1.0 - 3.6 K/uL   Monocytes Relative 9 %   Monocytes Absolute 1.7 (H) 0.2 - 1.0 K/uL   Eosinophils Relative 0 %   Eosinophils Absolute 0.0 0 - 0.7 K/uL   Basophils Relative 0 %   Basophils Absolute 0.1 0 - 0.1 K/uL    Comment: Performed at [facility], [address] [place], [place] [address]  Protime-INR     Status: None   Collection Time: 07/09/17  5:18 PM  Result Value Ref Range   Prothrombin Time 14.4 11.4 - 15.2 seconds   INR 1.13     Comment: Performed at [facility], [address], [place], [place] [address]  Basic metabolic panel     Status: Abnormal   Collection Time: 07/09/17  5:18 PM  Result Value Ref Range   Sodium 140 135 - 145 mmol/L   Potassium 3.8 3.5 - 5.1 mmol/L   Chloride 112 (H) 98 - 111 mmol/L    Comment: Please note change in reference range.   CO2 20 (L) 22 - 32 mmol/L   Glucose, Bld 97 70 - 99 mg/dL    Comment: Please note change in reference range.   BUN 11 6 - 20 mg/dL    Comment: Please note change in reference range.   Creatinine, Ser 0.89 0.61 - 1.24 mg/dL   Calcium 7.5 (L) 8.9 - 10.3 mg/dL   GFR calc non Af Amer >60 >60 mL/min   GFR calc Af Amer >60 >60 mL/min    Comment:  (NOTE) The eGFR has been calculated using the CKD EPI equation. This calculation has not been validated in all clinical situations. eGFR's persistently <60 mL/min signify possible Chronic Kidney Disease.    Anion gap 8 5 -  15    Comment: Performed at [facility], [address] [place], [place] [address]  Urine Drug Screen, Qualitative [facility] only)     Status: Abnormal   Collection Time: 07/09/17  6:18 PM  Result Value Ref Range   Tricyclic, Ur Screen NONE DETECTED NONE DETECTED   Amphetamines, Ur Screen POSITIVE (A) NONE DETECTED   MDMA (Ecstasy)Ur Screen NONE DETECTED NONE DETECTED   Cocaine Metabolite,Ur [place] NONE DETECTED NONE DETECTED   Opiate, Ur Screen NONE DETECTED NONE DETECTED   Phencyclidine (PCP) Ur S POSITIVE (A) NONE DETECTED   Cannabinoid 50 Ng, Ur [place] POSITIVE (A) NONE DETECTED   Barbiturates, Ur Screen (A) NONE DETECTED    Result not available. Reagent lot number recalled by manufacturer.   Benzodiazepine, Ur Scrn POSITIVE (A) NONE DETECTED   Methadone Scn, Ur NONE DETECTED NONE DETECTED    Comment: (NOTE) Tricyclics + metabolites, urine    Cutoff 1000 ng/mL Amphetamines + metabolites, urine  Cutoff 1000 ng/mL MDMA (Ecstasy), urine              Cutoff 500 ng/mL Cocaine Metabolite, urine          Cutoff 300 ng/mL Opiate + metabolites, urine        Cutoff 300 ng/mL Phencyclidine (PCP), urine         Cutoff 25 ng/mL Cannabinoid, urine                 Cutoff 50 ng/mL Barbiturates + metabolites, urine  Cutoff 200 ng/mL Benzodiazepine, urine              Cutoff 200 ng/mL Methadone, urine                   Cutoff 300 ng/mL The urine drug screen provides only a preliminary, unconfirmed analytical test result and should not be used for non-medical purposes. Clinical consideration and professional judgment should be applied to any positive drug screen result due to possible interfering substances. A more specific alternate chemical method must be used in order to  obtain a confirmed analytical result. Gas chromatography / mass spectrometry (GC/MS) is the preferred confirmat ory method. Performed at [facility], [address] [place], [place] [address]   Blood gas, arterial     Status: Abnormal   Collection Time: 07/09/17  9:11 PM  Result Value Ref Range   FIO2 0.60    Delivery systems BILEVEL POSITIVE AIRWAY PRESSURE    Inspiratory PAP 15    Expiratory PAP 5    pH, Arterial 7.42 7.350 - 7.450   pCO2 arterial 35 32.0 - 48.0 mmHg   pO2, Arterial 74 (L) 83.0 - 108.0 mmHg   Bicarbonate 22.7 20.0 - 28.0 mmol/L   Acid-base deficit 1.2 0.0 - 2.0 mmol/L   O2 Saturation 95.0 %   Patient temperature 37.0    Collection site RIGHT RADIAL    Sample type ARTERIAL DRAW    Allens test (pass/fail) PASS PASS   Mechanical Rate 10     Comment: Performed at [facility], [address], [place], [place] [address]  Basic metabolic panel     Status: Abnormal   Collection Time: 07/10/17  5:38 AM  Result Value Ref Range   Sodium 138 135 - 145 mmol/L   Potassium 3.8 3.5 - 5.1 mmol/L   Chloride 107 98 - 111 mmol/L    Comment: Please note change in reference range.   CO2 22 22 - 32 mmol/L   Glucose,  Bld 130 (H) 70 - 99 mg/dL    Comment: Please note change in reference range.   BUN 10 6 - 20 mg/dL    Comment: Please note change in reference range.   Creatinine, Ser 0.69 0.61 - 1.24 mg/dL   Calcium 8.0 (L) 8.9 - 10.3 mg/dL   GFR calc non Af Amer >60 >60 mL/min   GFR calc Af Amer >60 >60 mL/min    Comment: (NOTE) The eGFR has been calculated using the CKD EPI equation. This calculation has not been validated in all clinical situations. eGFR's persistently <60 mL/min signify possible Chronic Kidney Disease.    Anion gap 9 5 - 15    Comment: Performed at [facility], [address] [place], [place] [address]  Magnesium     Status: None   Collection Time: 07/10/17  5:38 AM  Result Value Ref Range   Magnesium 1.9 1.7 - 2.4  mg/dL    Comment: Performed at [facility], [address] [place], [place] [address]  Phosphorus     Status: None   Collection Time: 07/10/17  5:38 AM  Result Value Ref Range   Phosphorus 2.5 2.5 - 4.6 mg/dL    Comment: Performed at [facility], [address] [place], [place] [address]  Glucose, capillary     Status: Abnormal   Collection Time: 07/10/17  7:16 AM  Result Value Ref Range   Glucose-Capillary 125 (H) 70 - 99 mg/dL  Basic metabolic panel     Status: Abnormal   Collection Time: 07/10/17 12:45 PM  Result Value Ref Range   Sodium 137 135 - 145 mmol/L   Potassium 3.7 3.5 - 5.1 mmol/L   Chloride 106 98 - 111 mmol/L    Comment: Please note change in reference range.   CO2 22 22 - 32 mmol/L   Glucose, Bld 114 (H) 70 - 99 mg/dL    Comment: Please note change in reference range.   BUN 8 6 - 20 mg/dL    Comment: Please note change in reference range.   Creatinine, Ser 0.74 0.61 - 1.24 mg/dL   Calcium 7.9 (L) 8.9 - 10.3 mg/dL   GFR calc non Af Amer >60 >60 mL/min   GFR calc Af Amer >60 >60 mL/min    Comment: (NOTE) The eGFR has been calculated using the CKD EPI equation. This calculation has not been validated in all clinical situations. eGFR's persistently <60 mL/min signify possible Chronic Kidney Disease.    Anion gap 9 5 - 15    Comment: Performed at [facility], [place], [place] [address]  Lactic acid, plasma     Status: None   Collection Time: 07/10/17  2:20 PM  Result Value Ref Range   Lactic Acid, Venous 1.7 0.5 - 1.9 mmol/L    Comment: Performed at [facility], [address] [place],  [address]    Current Facility-Administered Medications  Medication Dose Route Frequency Provider Last Rate Last Dose  . 0.9 %  sodium chloride infusion  250 mL Intravenous PRN Arta Silence, MD      . dexmedetomidine (PRECEDEX) 400 MCG/100ML (4 mcg/mL) infusion  0.4-1.8 mcg/kg/hr Intravenous Titrated  Awilda Bill, NP 3.4 mL/hr at 07/10/17 1601 0.2 mcg/kg/hr at 07/10/17 1601  . lactated ringers 1,000 mL with potassium chloride 40 mEq infusion   Intravenous Continuous Flora Lipps, MD 75 mL/hr at 07/10/17 1400    . LORazepam (ATIVAN) injection 1-2 mg  1-2  mg Intravenous Q4H PRN Awilda Bill, NP   2 mg at 07/10/17 0114  . LORazepam (ATIVAN) injection 2 mg  2 mg Intravenous Q6H Clapacs, John T, MD   2 mg at 07/10/17 1029  . ziprasidone (GEODON) injection 20 mg  20 mg Intramuscular Q4H PRN Flora Lipps, MD   20 mg at 07/10/17 1327    Musculoskeletal: Strength & Muscle Tone: decreased Gait & Station: unable to stand Patient leans: N/A  Psychiatric Specialty Exam: Physical Exam  Nursing note and vitals reviewed. Constitutional: He appears well-developed and well-nourished.  HENT:  Head: Normocephalic and atraumatic.  Eyes: Pupils are equal, round, and reactive to light. Conjunctivae are normal.  Neck: Normal range of motion.  Cardiovascular: Regular rhythm and normal heart sounds.  Respiratory: He is in respiratory distress.  GI: Soft.  Musculoskeletal: Normal range of motion.  Neurological: He is alert.  Skin: Skin is warm and dry.  Psychiatric: His affect is blunt.    Review of Systems  Unable to perform ROS: Mental status change    Blood pressure 112/70, pulse 96, temperature 98.1 F (36.7 C), temperature source Oral, resp. rate (!) 22, height 6' (1.829 m), weight 61.5 kg (135 lb 9.3 oz), SpO2 98 %.Body mass index is 18.39 kg/m.  General Appearance: Disheveled  Eye Contact:  None  Speech:  Negative  Volume:  Decreased  Mood:  Negative  Affect:  Negative  Thought Process:  NA  Orientation:  Negative  Thought Content:  Negative  Suicidal Thoughts:  No  Homicidal Thoughts:  No  Memory:  Negative  Judgement:  Negative  Insight:  Negative  Psychomotor Activity:  Negative  Concentration:  Concentration: Negative  Recall:  Negative  Fund of Knowledge:  Negative   Language:  Negative  Akathisia:  Negative  Handed:  Right  AIMS (if indicated):     Assets:  Social Support  ADL's:  Impaired  Cognition:  Impaired,  Severe  Sleep:        Treatment Plan Summary: Plan Patient continues to be delirious unresponsive now on a BiPAP mask.  Was not able to get any information today.  Continue intensive care management including benzodiazepines for sedation.  I will follow-up as needed.  Disposition: see note  Alethia Berthold, MD 07/10/2017 6:00 PM

## 2017-07-10 NOTE — Progress Notes (Signed)
Pharmacy Electrolyte Monitoring Consult:  Pharmacy consulted to assist in monitoring and replacing electrolytes in this 32 y.o. male admitted on 07/09/2017 with Drug Overdose  MIVF: LR/Potassium 20mEq @ 6875mL/hr.    Labs:  Sodium (mmol/L)  Date Value  07/10/2017 137   Potassium (mmol/L)  Date Value  07/10/2017 3.7   Magnesium (mg/dL)  Date Value  16/10/960406/27/2019 1.9   Phosphorus (mg/dL)  Date Value  54/09/811906/27/2019 2.5   Calcium (mg/dL)  Date Value  14/78/295606/27/2019 7.9 (L)   Albumin (g/dL)  Date Value  21/30/865706/26/2019 3.6    Assessment/Plan: Magnesium 2g IV x 1 to maintain magnesium ~ 2.   Will change MIVF to LR/3940mEq of Potassium @75mL /hr to maintain potassium ~ 4.   Will recheck electrolytes with am labs.   Pharmacy will continue to monitor and adjust per consult.   Jaylyn Booher L 07/10/2017 4:14 PM

## 2017-07-11 LAB — BASIC METABOLIC PANEL
Anion gap: 8 (ref 5–15)
BUN: 10 mg/dL (ref 6–20)
CALCIUM: 7.9 mg/dL — AB (ref 8.9–10.3)
CO2: 22 mmol/L (ref 22–32)
CREATININE: 0.78 mg/dL (ref 0.61–1.24)
Chloride: 108 mmol/L (ref 98–111)
GFR calc Af Amer: >60 mL/min (ref >60)
GFR calc non Af Amer: >60 mL/min (ref >60)
GLUCOSE: 87 mg/dL (ref 70–99)
Potassium: 3.7 mmol/L (ref 3.5–5.1)
Sodium: 138 mmol/L (ref 135–145)

## 2017-07-11 LAB — MAGNESIUM: Magnesium: 2.1 mg/dL (ref 1.7–2.4)

## 2017-07-11 LAB — PHOSPHORUS: Phosphorus: 2.4 mg/dL — ABNORMAL LOW (ref 2.5–4.6)

## 2017-07-11 MED ORDER — POTASSIUM CHLORIDE 2 MEQ/ML IV SOLN
INTRAVENOUS | Status: DC
  Administered 2017-07-11: 08:00:00 via INTRAVENOUS
  Filled 2017-07-11 (×3): qty 1000

## 2017-07-11 MED ORDER — ADULT MULTIVITAMIN W/MINERALS CH
1.0000 | ORAL_TABLET | Freq: Every day | ORAL | Status: DC
  Administered 2017-07-11: 1 via ORAL
  Filled 2017-07-11: qty 1

## 2017-07-11 MED ORDER — NICOTINE 14 MG/24HR TD PT24
14.0000 mg | MEDICATED_PATCH | Freq: Every day | TRANSDERMAL | Status: DC
  Administered 2017-07-11: 14 mg via TRANSDERMAL
  Filled 2017-07-11: qty 1

## 2017-07-11 MED ORDER — KETOROLAC TROMETHAMINE 15 MG/ML IJ SOLN
15.0000 mg | Freq: Once | INTRAMUSCULAR | Status: AC
  Administered 2017-07-11: 15 mg via INTRAVENOUS
  Filled 2017-07-11: qty 1

## 2017-07-11 MED ORDER — VITAMIN B-1 100 MG PO TABS
100.0000 mg | ORAL_TABLET | Freq: Every day | ORAL | Status: DC
  Administered 2017-07-11: 100 mg via ORAL
  Filled 2017-07-11: qty 1

## 2017-07-11 MED ORDER — FOLIC ACID 1 MG PO TABS
1.0000 mg | ORAL_TABLET | Freq: Every day | ORAL | Status: DC
  Administered 2017-07-11: 1 mg via ORAL
  Filled 2017-07-11: qty 1

## 2017-07-11 MED ORDER — POTASSIUM CHLORIDE 20 MEQ PO PACK
40.0000 meq | PACK | Freq: Two times a day (BID) | ORAL | Status: DC
  Administered 2017-07-11 (×2): 40 meq via ORAL
  Filled 2017-07-11 (×2): qty 2

## 2017-07-11 NOTE — Progress Notes (Signed)
[facility] [place] [facility]   PATIENT NAME: Eric Davila    MR#:  [id number]  DATE OF BIRTH:  Dec 09, 1985  SUBJECTIVE:  complains of aches and pains. Sitter in the room. No new other complaints. Eating well.  REVIEW OF SYSTEMS:   Review of Systems  Constitutional: Negative for chills, fever and weight loss.  HENT: Negative for ear discharge, ear pain and nosebleeds.   Eyes: Negative for blurred vision, pain and discharge.  Respiratory: Negative for sputum production, shortness of breath, wheezing and stridor.   Cardiovascular: Negative for chest pain, palpitations, orthopnea and PND.  Gastrointestinal: Negative for abdominal pain, diarrhea, nausea and vomiting.  Genitourinary: Negative for frequency and urgency.  Musculoskeletal: Positive for back pain and joint pain.  Neurological: Negative for sensory change, speech change, focal weakness and weakness.  Psychiatric/Behavioral: Negative for depression and hallucinations. The patient is not nervous/anxious.      DRUG ALLERGIES:   Allergies  Allergen Reactions  . Latex Rash  . Tape Rash    VITALS:  Blood pressure 119/77, pulse (!) 103, temperature 98.6 F (37 C), temperature source Oral, resp. rate 20, height 6' (1.829 m), weight 58.1 kg (128 lb 1.4 oz), SpO2 93 %.  PHYSICAL EXAMINATION:   Physical Exam  GENERAL:  32 y.o.-year-old patient lying in the bed with no acute distress. restless EYES: Pupils equal, round, reactive to light and accommodation. No scleral icterus. Extraocular muscles intact.  HEENT: Head atraumatic, normocephalic. Oropharynx and nasopharynx clear. BIPAP NECK:  Supple, no jugular venous distention. No thyroid enlargement, no tenderness.  LUNGS: Normal breath sounds bilaterally, no wheezing, rales, rhonchi. No use of accessory muscles of respiration.  CARDIOVASCULAR: S1, S2 normal. No murmurs, rubs, or gallops.  ABDOMEN: Soft, nontender, nondistended. Bowel sounds  present. No organomegaly or mass.  EXTREMITIES: No cyanosis, clubbing or edema b/l.    NEUROLOGIC: moves all extremties well PSYCHIATRIC:  patient is alert but restless SKIN: No obvious rash, lesion, or ulcer.   LABORATORY PANEL:  CBC Recent Labs  Lab 07/09/17 1718  WBC 18.1*  HGB 13.9  HCT 41.9  PLT 180    Chemistries  Recent Labs  Lab 07/09/17 0204  07/11/17 0349  NA 140   < > 138  K 3.5   < > 3.7  CL 109   < > 108  CO2 23   < > 22  GLUCOSE 87   < > 87  BUN 10   < > 10  CREATININE 1.19   < > 0.78  CALCIUM 8.0*   < > 7.9*  MG 1.6*   < > 2.1  AST 26  --   --   ALT 12  --   --   ALKPHOS 63  --   --   BILITOT 0.3  --   --    < > = values in this interval not displayed.   Cardiac Enzymes No results for input(s): TROPONINI in the last 168 hours. RADIOLOGY:  No results found. ASSESSMENT AND PLAN:  32 yo male with a PMH of Depression and Anxiety.  He presented to [facility] ER on 06/26 via EMS following an intentional drug overdose.  Per H&P the pt was found down with pill bottles on the floor. Pt took Effexor and Cymbalta, these were his wife's medications. En route to the ER he had seizure-like activity, therefore he received versed.   * Intentional drug overdose -pt now extubated -Sitter in the room -IV precedex for  agitation---now off -psychiatry consultation noted. D/c disposition per psych input  *??seziure like activity -EEG neg for true epilepsy  *DVT prophylaxis Lovenox  Case discussed with Care Management/Social Worker. Management plans discussed with the patient, family and they are in agreement.  CODE STATUS: full  DVT Prophylaxis: Lovenox  TOTAL TIME TAKING CARE OF THIS PATIENT: *30* minutes.  >50% time spent on counselling and coordination of care  POSSIBLE D/C IN 1-2 DAYS, DEPENDING ON CLINICAL CONDITION.  Note: This dictation was prepared with Dragon dictation along with smaller phrase technology. Any transcriptional errors that result from this  process are unintentional.  Enedina FinnerSona Bell Carbo M.D on 07/11/2017 at 1:51 PM  Between 7am to 6pm - Pager - [phone]  After 6pm go to [email or web address] - password [email or web address]  [facility]  Office  [phone]  CC: Primary care physician; Patient, No Pcp PerPatient ID: Eric Davila, male   DOB: 07/09/1985, 32 y.o.   MRN: [id number]

## 2017-07-11 NOTE — Progress Notes (Signed)
[facility] pt transported with his 1:1 sitter in wheelchair, chart and meds sent with him, Dr Toni Amendlapacs confirmed sitter will no longer be needed, signed DC of commitment papers in placed in chart.  Pt A&O x 4, on room air, agreeable to transfer, wife at bedside.
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
## 2017-07-11 NOTE — Progress Notes (Signed)
Follow up - Critical Care Medicine Note  Patient Details:    Eric BrunnerDavid Davila is an 32 y.o. male. admitted following intentional drug overdose requiring mechanical intubation for airway protection    Lines, Airways, Drains: Urethral Catheter Eric SakeA Brandon, MD Double-lumen 18 Fr. (Active)  Indication for Insertion or Continuance of Catheter Acute urinary retention 07/11/2017  8:00 AM  Site Assessment Bleeding 07/11/2017  8:00 AM  Catheter Maintenance Bag below level of bladder;Catheter secured;Drainage bag/tubing not touching floor;Insertion date on drainage bag;No dependent loops;Seal intact 07/11/2017  8:00 AM  Collection Container Standard drainage bag 07/11/2017  8:00 AM  Securement Method Securing device (Describe) 07/11/2017  8:00 AM  Urinary Catheter Interventions Unclamped 07/10/2017  8:00 PM  Input (mL) 950 mL 07/09/2017  6:00 PM  Output (mL) 325 mL 07/11/2017  8:00 AM    Anti-infectives:  Anti-infectives (From admission, onward)   None      Microbiology: Results for orders placed or performed during the hospital encounter of 07/09/17  MRSA PCR Screening     Status: None   Collection Time: 07/09/17  4:29 AM  Result Value Ref Range Status   MRSA by PCR NEGATIVE NEGATIVE Final    Comment:        The GeneXpert MRSA Assay (FDA approved for NASAL specimens only), is one component of Eric comprehensive MRSA colonization surveillance program. It is not intended to diagnose MRSA infection nor to guide or monitor treatment for MRSA infections. Performed at [facility], [address] [place], [place] [address]   Studies: Dg Abd 1 View  Result Date: 07/09/2017 CLINICAL DATA:  32 year old male status post NGT placement. EXAM: ABDOMEN - 1 VIEW COMPARISON:  Abdominal radiograph dated 07/09/2017 FINDINGS: An enteric tube is partially visualized with side-port just distal to the gastroesophageal junction and tip in the body of the stomach. Recommend further advancing of the tube for  optimal positioning. No dilatation of small bowel. Air is noted in the colon. No free air. IMPRESSION: Enteric tube with tip in the gastric body. Electronically Signed   By: Elgie CollardArash  Radparvar M.D.   On: 07/09/2017 06:13   Dg Abdomen 1 View  Result Date: 07/09/2017 CLINICAL DATA:  32 y/o  M; post intubation. EXAM: ABDOMEN - 1 VIEW COMPARISON:  None. FINDINGS: Normal bowel gas pattern. Enteric tube tip projects over the gastroesophageal junction, advancement recommended. Mild levocurvature of the lumbar spine. No acute osseous abnormality is evident. IMPRESSION: Enteric tube tip projects over gastroesophageal junction, advancement recommended. Electronically Signed   By: Mitzi HansenLance  Furusawa-Stratton M.D.   On: 07/09/2017 02:42   Ct Head Wo Contrast  Result Date: 07/09/2017 CLINICAL DATA:  32 y/o M; altered level of consciousness. Overdose. EXAM: CT HEAD WITHOUT CONTRAST TECHNIQUE: Contiguous axial images were obtained from the base of the skull through the vertex without intravenous contrast. COMPARISON:  None. FINDINGS: Brain: No evidence of acute infarction, hemorrhage, hydrocephalus, extra-axial collection or mass lesion/mass effect. Vascular: No hyperdense vessel or unexpected calcification. Skull: Normal. Negative for fracture or focal lesion. Sinuses/Orbits: Mild maxillary sinus mucosal thickening and partial opacification of the sphenoid sinus with fluid level, probably due to intubation. Normal aeration of mastoid air cells. Orbits are unremarkable. Other: None. IMPRESSION: Negative CT of the head. No acute intracranial abnormality identified. Electronically Signed   By: Mitzi HansenLance  Furusawa-Stratton M.D.   On: 07/09/2017 04:27   Dg Chest Port 1 View  Result Date: 07/09/2017 CLINICAL DATA:  32 y/o  M; post intubation. EXAM: PORTABLE CHEST 1 VIEW COMPARISON:  None. FINDINGS: Endotracheal tube tip projects 4.4 cm above the carina. Enteric tube tip extends below the field of view. Clear lungs. No pneumothorax  or pleural effusion. Bones are unremarkable. IMPRESSION: 1. Endotracheal tube tip 4.4 cm above carina. 2. Clear lungs. Electronically Signed   By: Mitzi Hansen M.D.   On: 07/09/2017 02:40    Consults: Treatment Team:  Barbaraann Rondo, MD Pccm, Raymond Gurney, MD Vanna Scotland, MD Clapacs, Jackquline Denmark, MD   Subjective:    Overnight Issues: has had some improvement in mental status although still confused.  Objective:  Vital signs for last 24 hours: Temp:  [98.1 F (36.7 C)-99.2 F (37.3 C)] 98.8 F (37.1 C) (06/28 0800) Pulse Rate:  [84-111] 111 (06/28 0800) Resp:  [21-45] 28 (06/28 0800) BP: (100-122)/(53-84) 111/75 (06/28 0800) SpO2:  [89 %-99 %] 94 % (06/28 0800) FiO2 (%):  [40 %] 40 % (06/27 1200) Weight:  [128 lb 1.4 oz (58.1 kg)] 128 lb 1.4 oz (58.1 kg) (06/28 0422)  Hemodynamic parameters for last 24 hours:    Intake/Output from previous day: 06/27 0701 - 06/28 0700 In: 1882.9 [I.V.:1732.9] Out: 3515 [Urine:3515]  Intake/Output this shift: Total I/O In: 312 [P.O.:237; Other:75] Out: 325 [Urine:325]  Vent settings for last 24 hours: FiO2 (%):  [40 %] 40 %  Physical Exam:  Vital signs: Please see the above listed vital signs Patient is responsive, is able to communicate however is confused with short attention span Cardiovascular: Regular rate and rhythm. QTC being followed Pulmonary: Clear to auscultation Abdominal: Positive bowel sounds soft exam Extremities: No clubbing cyanosis or edema noted Neurologic: Moves all extremities  Assessment/Plan:   32 year old gentleman with acute intentional drug overdose. Patient has tested positive for amphetamines, PCP, cannabinoids, benzodiazepine. Patient with ingestion of Effexor and Cymbalta which were his wife's medications. Acute  encephalopathy and inability to protect airway,  Status post successful extubation. Patient has had delirium and urinary retention. Psychiatry is following, patient has Eric  Software engineer.patient has been weaned off of Precedex, is on as needed Ativan and Geodon. Patient should be stable for floor transfer with monitor and consider  Nafeesah Lapaglia 07/11/2017  *Care during the described time interval was provided by me and/or other providers on the critical care team.  I have reviewed this patient's available data, including medical history, events of note, physical examination and test results as part of my evaluation.

## 2017-07-11 NOTE — Progress Notes (Signed)
Pharmacy Electrolyte Monitoring Consult:  Pharmacy consulted to assist in monitoring and replacing electrolytes in this 32 y.o. male admitted on 07/09/2017 with Drug Overdose  MIVF: LR/Potassium 40mEq @ 5375mL/hr.    Labs:  Sodium (mmol/L)  Date Value  07/11/2017 138   Potassium (mmol/L)  Date Value  07/11/2017 3.7   Magnesium (mg/dL)  Date Value  16/10/960406/28/2019 2.1   Phosphorus (mg/dL)  Date Value  54/09/811906/28/2019 2.4 (L)   Calcium (mg/dL)  Date Value  14/78/295606/28/2019 7.9 (L)   Albumin (g/dL)  Date Value  21/30/865706/26/2019 3.6    Assessment/Plan: Patient with continued QTc prolongation. Will continue potassium 40mEq PO BID for goal potassium ~4. No magnesium replacement warranted at this time. Goal magnesium ~ 2.   Will recheck electrolytes with am labs.   Pharmacy will continue to monitor and adjust per consult.   Akeylah Hendel L 07/11/2017 5:38 PM
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
## 2017-07-11 NOTE — Care Management (Signed)
RNCM consult for suicide attempt. Behavioral health following. Noted that patient is delirious and unresponsive.

## 2017-07-11 NOTE — Progress Notes (Signed)
Pt up to chair with 1 assist, on room air now, all gtts discontinued, taking oral meds w/o any issues, food intake has been poor so far this AM.  PT easily reoriented to date, he is oriented to place, does not remember what brought him to the hospital, although he states he thinks "I took Wellbutrin," which is not correct per his H&P.  Awaiting a floor bed at this time.  Sitter in room w/i arm's reach of patient.

## 2017-07-11 NOTE — Progress Notes (Addendum)
Wife came up to me and states that pt want to take his foley off. Went to pt room and explained that as per urologist note they are the only one allowed to take them off. Pt agreed not to take them off at this moment. He also complaints of 10 out of 10 pain and requesting for a nicotine patch order. Page prime. Awaiting callback. Will continue to monitor.  Doctor Anne HahnWillis placed an order for Toradol 15 mg once and nicotine patch 15 mg daily. Will continue to monitor.

## 2017-07-12 LAB — BASIC METABOLIC PANEL
ANION GAP: 8 (ref 5–15)
BUN: 15 mg/dL (ref 6–20)
CALCIUM: 8.3 mg/dL — AB (ref 8.9–10.3)
CO2: 21 mmol/L — AB (ref 22–32)
CREATININE: 0.71 mg/dL (ref 0.61–1.24)
Chloride: 107 mmol/L (ref 98–111)
GFR calc Af Amer: >60 mL/min (ref >60)
Glucose, Bld: 87 mg/dL (ref 70–99)
Potassium: 4 mmol/L (ref 3.5–5.1)
SODIUM: 136 mmol/L (ref 135–145)

## 2017-07-12 LAB — MAGNESIUM: MAGNESIUM: 2.1 mg/dL (ref 1.7–2.4)

## 2017-07-12 MED ORDER — KETOROLAC TROMETHAMINE 15 MG/ML IJ SOLN: 15.0000 mg | Freq: Four times a day (QID) | INTRAMUSCULAR | Status: DC | PRN

## 2017-07-12 NOTE — Plan of Care (Signed)
  Problem: Education: Goal: Knowledge of General Education information will improve Outcome: Progressing   Problem: Coping: Goal: Level of anxiety will decrease Outcome: Progressing   Problem: Safety: Goal: Ability to remain free from injury will improve Outcome: Progressing   

## 2017-07-12 NOTE — Progress Notes (Signed)
Urology told the MD the foley could be removed.  RN removed the foley.  There was no blood.  Patient urinated.  MD said he could go home.  Patient walked out with his wife and would not wait for a wheelchair.  He stated he was fine.  Christean GriefShylon M Jasmain Ahlberg, RN

## 2017-07-12 NOTE — Progress Notes (Signed)
Patient wants the foley out.  His wife stated he was pulling on it.  RN informed patient and wife that there is a balloon inside and if he pulls on the catheter, he will damage his penis and cause it to bleed.  Patient and wife understood and stated they will leave it alone.  RN reminded them that only a urologist can remove it because it was put in by urology.  Christean GriefShylon M Javarion Douty, RN

## 2017-07-12 NOTE — Care Management Note (Signed)
Case Management Note  Patient Details  Name: Eric BrunnerDavid Davila MRN: [id number] Date of Birth: 07-06-1985  Subjective/Objective:     Patient without insurance. No ordered discharge medications at this time. [facility] and Medication management application given. CSW also spoke with patient regarding facilities for suicide attempts and drug abuse. RNCM to sign off               Action/Plan:   Expected Discharge Date:  07/12/17               Expected Discharge Plan:     In-House Referral:     Discharge planning Services  CM Consult, Indigent Health Clinic, Medication Assistance  Post Acute Care Choice:    Choice offered to:     DME Arranged:    DME Agency:     HH Arranged:    HH Agency:     Status of Service:     If discussed at [employer] Meetings, dates discussed:    Additional Comments:  Virgel ManifoldJosh A Martese Vanatta, RN 07/12/2017, 11:27 AM

## 2017-07-12 NOTE — Progress Notes (Signed)
Pharmacy Electrolyte Monitoring Consult:  Pharmacy consulted to assist in monitoring and replacing electrolytes in this 32 y.o. male admitted on 07/09/2017 with Drug Overdose  MIVF: LR/Potassium 40mEq @ 4875mL/hr.    Labs:  Sodium (mmol/L)  Date Value  07/12/2017 136   Potassium (mmol/L)  Date Value  07/12/2017 4.0   Magnesium (mg/dL)  Date Value  09/81/191406/29/2019 2.1   Phosphorus (mg/dL)  Date Value  78/29/562106/28/2019 2.4 (L)   Calcium (mg/dL)  Date Value  30/86/578406/29/2019 8.3 (L)   Albumin (g/dL)  Date Value  69/62/952806/26/2019 3.6    Assessment/Plan:. Will continue potassium 40mEq PO BID for goal potassium ~4 due to QTc prolongation. No magnesium replacement warranted at this time. Goal magnesium ~ 2.   Will recheck electrolytes with am labs on Monday.   Pharmacy will continue to monitor and adjust per consult.   Olene FlossMelissa D Mirai Greenwood, Pharm.D, BCPS Clinical Pharmacist 07/12/2017 7:30 AM

## 2017-07-18 NOTE — Discharge Summary (Signed)
[facility] [place] [facility]   PATIENT NAME: Eric Davila    MR#:  [id number]  DATE OF BIRTH:  Apr 21, 1985  DATE OF ADMISSION:  07/09/2017 ADMITTING PHYSICIAN: Barbaraann RondoPrasanna Sridharan, MD  DATE OF DISCHARGE: 07/12/2017 12:39 PM  PRIMARY CARE PHYSICIAN: Patient, No Pcp Per   ADMISSION DIAGNOSIS:  Intentional drug overdose, initial encounter (HCC) [T50.902A]  DISCHARGE DIAGNOSIS:  Principal Problem:   Acute delirium Active Problems:   Antidepressant overdose   Overdose   Opiate abuse, episodic (HCC)   SECONDARY DIAGNOSIS:  History reviewed. No pertinent past medical history.   ADMITTING HISTORY  Eric BrunnerDavid Housley  is a 32 y.o. male with a known history of anxiety/depression p/w intentional overdose, Cymbalta + Effexor. It is unknown if pt took any other medications. He was previously on Celexa for his anxiety/depression, based on prior outpatient documentation. The medications he took were his wife's. It is unknown why he overdosed on these medications. He was apparently found down with pill bottles on the floor. He took at least 7x Effexor 75mg  (525mg ) and possibly as many as 30x Cymbalta 30mg  (900mg  max). EMS was called. He reportedly displayed seizure activity (vs. clonus) en route to ED, and received Versed. He was highly agitated in the ED, and received Ativan. Tachycardia, agitation and clonus continued. He was ultimately intubated for airway protection. As such, he cannot provide Hx/ROS.  HOSPITAL COURSE:   * Intentional overdose with multiple medications This was due to acute rage. He was intubated in ED and admitted to ICU on vent. Extubated without any problem and later transferred to medical floor. Sitter and IVC. Seen by psychiatry and IVC discontinued and patient was discharged home in stable condition. He had no suicidal ideation.  * urinary retention due to meds. Foley placed and later once discontinued he was able to void without any issues.  * d/c home  and f/u with PCP in 1 week  CONSULTS OBTAINED:  Treatment Team:  Barbaraann RondoSridharan, Prasanna, MD Vanna ScotlandBrandon, Ashley, MD Clapacs, Jackquline DenmarkJohn T, MD  DRUG ALLERGIES:   Allergies  Allergen Reactions  . Latex Rash  . Tape Rash    DISCHARGE MEDICATIONS:   Allergies as of 07/12/2017      Reactions   Latex Rash   Tape Rash      Medication List    TAKE these medications   bismuth subsalicylate 262 MG/15ML suspension Commonly known as:  PEPTO BISMOL Take 30 mLs by mouth daily as needed.   ibuprofen 800 MG tablet Commonly known as:  ADVIL,MOTRIN Take 800 mg by mouth every 8 (eight) hours as needed for moderate pain.       Today   VITAL SIGNS:  Blood pressure 121/81, pulse 91, temperature 99.3 F (37.4 C), temperature source Oral, resp. rate 18, height 6' (1.829 m), weight 55.2 kg (121 lb 11.2 oz), SpO2 97 %.  I/O:  No intake or output data in the 24 hours ending 07/18/17 1540  PHYSICAL EXAMINATION:  Physical Exam  GENERAL:  32 y.o.-year-old patient lying in the bed with no acute distress.  LUNGS: Normal breath sounds bilaterally, no wheezing, rales,rhonchi or crepitation. No use of accessory muscles of respiration.  CARDIOVASCULAR: S1, S2 normal. No murmurs, rubs, or gallops.  ABDOMEN: Soft, non-tender, non-distended. Bowel sounds present. No organomegaly or mass.  NEUROLOGIC: Moves all 4 extremities. PSYCHIATRIC: The patient is alert and oriented x 3.  SKIN: No obvious rash, lesion, or ulcer.   DATA REVIEW:   CBC No results for input(s):  WBC, HGB, HCT, PLT in the last 168 hours.  Chemistries  Recent Labs  Lab 07/12/17 0504  NA 136  K 4.0  CL 107  CO2 21*  GLUCOSE 87  BUN 15  CREATININE 0.71  CALCIUM 8.3*  MG 2.1    Cardiac Enzymes No results for input(s): TROPONINI in the last 168 hours.  Microbiology Results  Results for orders placed or performed during the hospital encounter of 07/09/17  MRSA PCR Screening     Status: None   Collection Time: 07/09/17  4:29  AM  Result Value Ref Range Status   MRSA by PCR NEGATIVE NEGATIVE Final    Comment:        The GeneXpert MRSA Assay (FDA approved for NASAL specimens only), is one component of a comprehensive MRSA colonization surveillance program. It is not intended to diagnose MRSA infection nor to guide or monitor treatment for MRSA infections. Performed at [facility], [address] [place], [place] [address]     RADIOLOGY:  No results found.  Follow up with PCP in 1 week.  Management plans discussed with the patient, family and they are in agreement.  CODE STATUS:  Code Status History    Date Active Date Inactive Code Status Order ID Comments User Context   07/09/2017 0426 07/12/2017 1549 Full Code [id number]  Barbaraann Rondo, MD Inpatient      TOTAL TIME TAKING CARE OF THIS PATIENT ON DAY OF DISCHARGE: more than 30 minutes.   Molinda Bailiff Saray Capasso M.D on 07/18/2017 at 3:40 PM  Between 7am to 6pm - Pager - [phone]  After 6pm go to [email or web address] - password [email or web address]  [facility]  Office  [phone]  CC: Primary care physician; Patient, No Pcp Per  Note: This dictation was prepared with Dragon dictation along with smaller phrase technology. Any transcriptional errors that result from this process are unintentional.

## 2019-04-29 ENCOUNTER — Emergency Department (HOSPITAL_COMMUNITY): Payer: MEDICAID

## 2019-04-29 ENCOUNTER — Emergency Department
Admission: EM | Admit: 2019-04-29 | Discharge: 2019-04-29 | Disposition: A | Discharge status: Home / Self Care | Payer: MEDICAID | Attending: Emergency Medicine | Admitting: Emergency Medicine

## 2019-04-29 ENCOUNTER — Other Ambulatory Visit: Payer: Self-pay

## 2019-04-29 DIAGNOSIS — F1590 Other stimulant use, unspecified, uncomplicated: Secondary | ICD-10-CM | POA: Insufficient documentation

## 2019-04-29 DIAGNOSIS — T401X1A Poisoning by heroin, accidental (unintentional), initial encounter: Secondary | ICD-10-CM | POA: Insufficient documentation

## 2019-04-29 DIAGNOSIS — T40601A Poisoning by unspecified narcotics, accidental (unintentional), initial encounter: Secondary | ICD-10-CM

## 2019-04-29 LAB — BASIC METABOLIC PANEL
ANION GAP: 9 mmol/L
BUN/CREA RATIO: 10
BUN: 11 mg/dL (ref 10–25)
CALCIUM: 9.5 mg/dL (ref 8.8–10.3)
CHLORIDE: 101 mmol/L (ref 98–111)
CO2 TOTAL: 26 mmol/L (ref 21–35)
CREATININE: 1.07 mg/dL (ref <=1.30)
ESTIMATED GFR: >60 mL/min/{1.73_m2}
GLUCOSE: 289 mg/dL — ABNORMAL HIGH (ref 70–110)
POTASSIUM: 5.1 mmol/L — ABNORMAL HIGH (ref 3.5–5.0)
SODIUM: 136 mmol/L (ref 135–145)

## 2019-04-29 LAB — TROPONIN-I: TROPONIN I: <0.03 ng/mL (ref <=0.04)

## 2019-04-29 LAB — CBC WITH DIFF
BASOPHIL #: 0.1 10*3/uL (ref 0.00–0.20)
BASOPHIL %: 1 %
EOSINOPHIL #: 0.4 10*3/uL (ref 0.00–0.50)
EOSINOPHIL %: 4 %
HCT: 49.1 % (ref 38.9–50.5)
HGB: 16.5 g/dL (ref 13.4–17.3)
LYMPHOCYTE #: 1.3 10*3/uL (ref 0.80–3.20)
LYMPHOCYTE %: 12 %
MCH: 31 pg (ref 27.9–33.1)
MCHC: 33.7 g/dL (ref 32.8–36.0)
MCV: 92.1 fL (ref 82.4–95.0)
MONOCYTE #: 0.6 10*3/uL (ref 0.20–0.80)
MONOCYTE %: 6 %
MPV: 8.9 fL (ref 6.0–10.2)
NEUTROPHIL #: 8.7 10*3/uL — ABNORMAL HIGH (ref 1.60–5.50)
NEUTROPHIL %: 78 %
PLATELETS: 206 10*3/uL (ref 140–440)
RBC: 5.33 10*6/uL (ref 4.40–5.68)
RDW: 13.6 % (ref 10.9–15.1)
WBC: 11.1 10*3/uL — ABNORMAL HIGH (ref 3.3–9.3)

## 2019-04-29 LAB — URINE DRUG SCREEN
AMPHETAMINES URINE: POSITIVE — AB
BARBITURATES URINE: NEGATIVE
BENZODIAZEPINES URINE: NEGATIVE
BUPRENORPHINE URINE: NEGATIVE
CANNABINOIDS URINE: NEGATIVE
COCAINE METABOLITES URINE: NEGATIVE
OPIATES URINE: NEGATIVE
PCP URINE: NEGATIVE

## 2019-04-29 LAB — ETHANOL, SERUM: ETHANOL: <10 mg/dL (ref 0–10)

## 2019-04-29 MED ORDER — SODIUM CHLORIDE 0.9 % IV BOLUS
1000.00 mL | INJECTION | Status: AC
Start: 2019-04-29 — End: 2019-04-29
  Administered 2019-04-29: 1000 mL via INTRAVENOUS
  Administered 2019-04-29: 0 mL via INTRAVENOUS

## 2019-04-29 NOTE — ED Nurses Note (Signed)
Peripheral IV removed and site bandaged with 2x2 and band aid.  No bleeding noted.  Discharge instructions reviewed with patient/guardian by ED Nurse.  Opportunity for questions given and all questions answered.  No Prescriptions given for this visit.  Ambulatory from department .

## 2019-04-29 NOTE — ED Nurses Note (Signed)
Patient reports oding 3 times and wants help i set up fast track for MAT. Patient goes tomorrow.

## 2019-04-29 NOTE — ED Nurses Note (Signed)
Miguel Davis, TECH in patients room. EKG completed.

## 2019-04-29 NOTE — ED Provider Notes (Signed)
Department of Emergency Medicine  HPI - 04/29/2019    Attending: Dr. Freddi Che  Chief Complaint:  Chief Complaint   Patient presents with    Drug Overdose     OD on heroine.  Family did approx 10 mins of CPR per EMS. A/O arrival.      History of Present Illness:  Miguel Davis is a 34 y.o. male who presents via EMS with a chief complaint of overdose on heroin. The patient reports he snorted his typical amount today prior to overdosing. He reports he had not used heroin for a few days prior to this. He also reports recent methamphetamine use. Per EMS, the patients family performed 10 minutes of CPR and administered Narcan to the patient. The patient has been alert and oriented since that time. He denies pain, nausea, or vomiting. He denies any other current complaints.    History Limitations: None    Review of Systems:  Constitutional: No fever, chills or weakness  Skin: No rashes or diaphoresis  HENT: No congestion or rhinorrhea  Eyes: No vision changes, discharge  Cardio: No chest pain, palpitations or leg swelling   Respiratory: No cough, wheezing or SOB  GI:  No nausea, vomiting, diarrhea, constipation or abdominal pain  GU:  No dysuria, hematuria, polyuria  MSK: No joint or back pain  Neuro: No loss of sensation, headache, focal deficits or LOC  Psych: +substance abuse      Allergies:  No Known Allergies  Past Medical History:  No past medical history on file.    Past Surgical History:  No past surgical history pertinent negatives on file.      Social History:  Social History     Tobacco Use    Smoking status: Not on file   Substance Use Topics    Alcohol use: Not on file     Family History:  Family Medical History:     None            Physical Exam:  All nurse's notes reviewed.  Filed Vitals:    04/29/19 1630 04/29/19 1645 04/29/19 1700 04/29/19 1715   BP: (!) 147/96 (!) 144/94 (!) 141/91 (!) 141/99   Pulse: 85 87 (!) 110 93   Resp: 14 (!) 23 17 (!) 22   Temp:       SpO2: 99% 98% 100% 99%         Constitutional: No acute distress. Drowsy. Alert and Oriented x3.  HENT:   Head: Normocephalic, Atraumatic  Mouth/Throat: Oropharynx is clear and moist.  Eyes:Conjunctivae without discharge. Pupils 35m and reactive. Good eye contact.   Neck: Trachea midline.   Cardiovascular: Regular rate and rhythm  Pulmonary/Chest: Breath sounds equal bilaterally, good air movement. No respiratory distress. No wheezes, rales or chest tenderness.  Abdominal: Normal Bowel Sounds. Abdomen soft, no tenderness, rebound or guarding.  Musculoskeletal: No obvious deformity, swelling. 2+ peripheral pulses  Skin: Warm and dry. No rash, erythema, pallor or cyanosis.  Psychiatric: Behavior is normal. Mood and affect congruent. Cooperative.   Neurological: Alert & Oriented x3. Grossly intact. Moves all extremities spontaneously.    Orders, Abnormal Labs and Imaging Results:  Results up to the Time the Disposition was Entered   BASIC METABOLIC PANEL - Abnormal; Notable for the following components:       Result Value    POTASSIUM 5.1 (*)     GLUCOSE 289 (*)     All other components within normal limits    Narrative:  Estimated Glomerular Filtration Rate (eGFR) calculated using the CKD-EPI (2009) equation, intended for patients 60 years of age and older. If race and/or gender is not documented or "unknown," there will be no eGFR calculation.   URINE DRUG SCREEN - Abnormal; Notable for the following components:    AMPHETAMINES URINE Positive - Not Confirmed (*)     All other components within normal limits    Narrative:     Unconfirmed screening results must not be used for non-medical purposes   (e.g. employment testing, legal testing, etc.)    Cut-off values are listed below:  Amphetamines - < 1000 ng/mL  Barbiturates - < 200 ng/mL  Benzodiazepines - < 200 ng/mL  Cocaine - < 300 ng/mL  Opiate - < 300 ng/mL  Oxycodone - < 100 ng/mL  PCP - < 25 ng/mL  THC5 - < 50 ng/mL  Buprenorphine - < 5 ng/mL  Methadone - < 300 ng/mL   CBC WITH DIFF -  Abnormal; Notable for the following components:    WBC 11.1 (*)     NEUTROPHIL # 8.70 (*)     All other components within normal limits   TROPONIN-I - Normal   ETHANOL, SERUM - Normal   ECG 12 LEAD - ED USE    Narrative:     -------------------------ECG Interpretation-----------------------------------  Sinus rhythm  Left axis deviation  Borderline prolonged QT interval  .  .  .  MD Signature: Unconfirmed Diagnosis   CBC/DIFF    Narrative:     The following orders were created for panel order CBC/DIFF.  Procedure                               Abnormality         Status                     ---------                               -----------         ------                     CBC WITH EGBT[[id number]]                Abnormal            Final result                 Please view results for these tests on the individual orders.   XR AP MOBILE CHEST    Narrative:     Miguel Davis    PROCEDURE DESCRIPTION: XR AP MOBILE CHEST    CLINICAL INDICATION: heroin overdose received CPR now awake    TECHNIQUE: 1 views / 1 images submitted.    COMPARISON: No prior studies were compared.      FINDINGS:   The mediastinum and cardiac silhouette are not enlarged. There is no  pneumothorax or pleural effusion. There is no pulmonary infiltrate or  atelectasis.     NS bolus infusion 1,000 mL (0 mL Intravenous Stopped 04/29/19 1915)       EKG: Reviewed by me.   MDM:   ED Course as of Apr 28 2104   Thu Apr 29, 2019   1734 COWS score is 1.  Negative opiates on urine drug screen.  Pupils not dilated.  Narcan was given by family bc was not responsive.  They performed CPR.  Was awake and alert by time of EMS arrival.  Positive for meth which pt admitted to using today as well.    [KM]   6226 On reexam, pt feels ok with plan of discharge.  Has family member in room whom he lives with who agrees to watch him.      [KM]      ED Course User Index  [KM] Rithwik Schmieg, Stephannie Li, MD       Critical care time: none  Consults: none  Impression:   Encounter  Diagnosis   Name Primary?    Opiate overdose (CMS [place]) Yes     Disposition:  Discharged    Discharge: Following the above history, physical exam, and studies, the patient was deemed stable and suitable for discharge.  It was advised that the patient return to the ED if they develop new or any other concerning symptoms and follow up as directed.   The patient's family verbalized understanding of all instructions and had no further questions or concerns.  Follow Up:   Comprehensive Treatment  [place], [place]  In 1 day  As scheduled 8am    Prescriptions:   There are no discharge medications for this patient.           No future appointments.    This note may have been partially generated using MModal Fluency Direct system, and there may be some incorrect words, spellings, and punctuation that were not noted in checking the note before saving.    I am scribing for, and in the presence of Dr. Freddi Che, MD for services provided on 04/29/2019.  Harrington Challenger, SCRIBE   Harrington Challenger, SCRIBE  04/29/2019, 16:10    I personally performed the services described in this documentation, as scribed  in my presence, and it is both accurate  and complete.    Wyatt Haste, MD

## 2019-05-02 LAB — ECG 12 LEAD - ED USE
Calculated P Axis: 64 deg
Calculated T Axis: 58 deg
EKG Severity: BORDERLINE
Heart Rate: 77 {beats}/min
I 40 Axis: 83 deg
PR Interval: 153 ms
QRS Axis: -64 deg
QRS Duration: 109 ms
QT Interval: 431 ms
QTC Calculation: 488 ms
ST Axis: 81 deg
T 40 Axis: 265 deg

## 2019-05-07 ENCOUNTER — Telehealth (HOSPITAL_COMMUNITY): Payer: Self-pay

## 2019-05-12 ENCOUNTER — Telehealth (HOSPITAL_COMMUNITY): Payer: Self-pay

## 2019-05-12 NOTE — ED Nurses Note (Signed)
PRC tried to contact patient and got no answer. PRC could not leave a voicemail because the phone just kept ringing. A second follow up call needs to occur.

## 2019-05-13 ENCOUNTER — Telehealth (HOSPITAL_COMMUNITY): Payer: Self-pay

## 2019-05-13 NOTE — ED Nurses Note (Signed)
PRC spoke with pt. Pt does have stable ride to outpatient treatment. Pt askiing about Suboxone pills. Pt states that they do not want the Suboxone stripts, that the pills are "easier to do". Pt does not want additional follow up from [facility]

## 2019-12-29 IMAGING — DX DG ABDOMEN 1V
1 series · 1 of 1 positions shown · non-contrast
Comparison: Abdominal radiograph dated 07/09/2017

CLINICAL DATA: 32-year-old male status post NGT placement.

EXAM:
ABDOMEN - 1 VIEW

[abdomen supine]
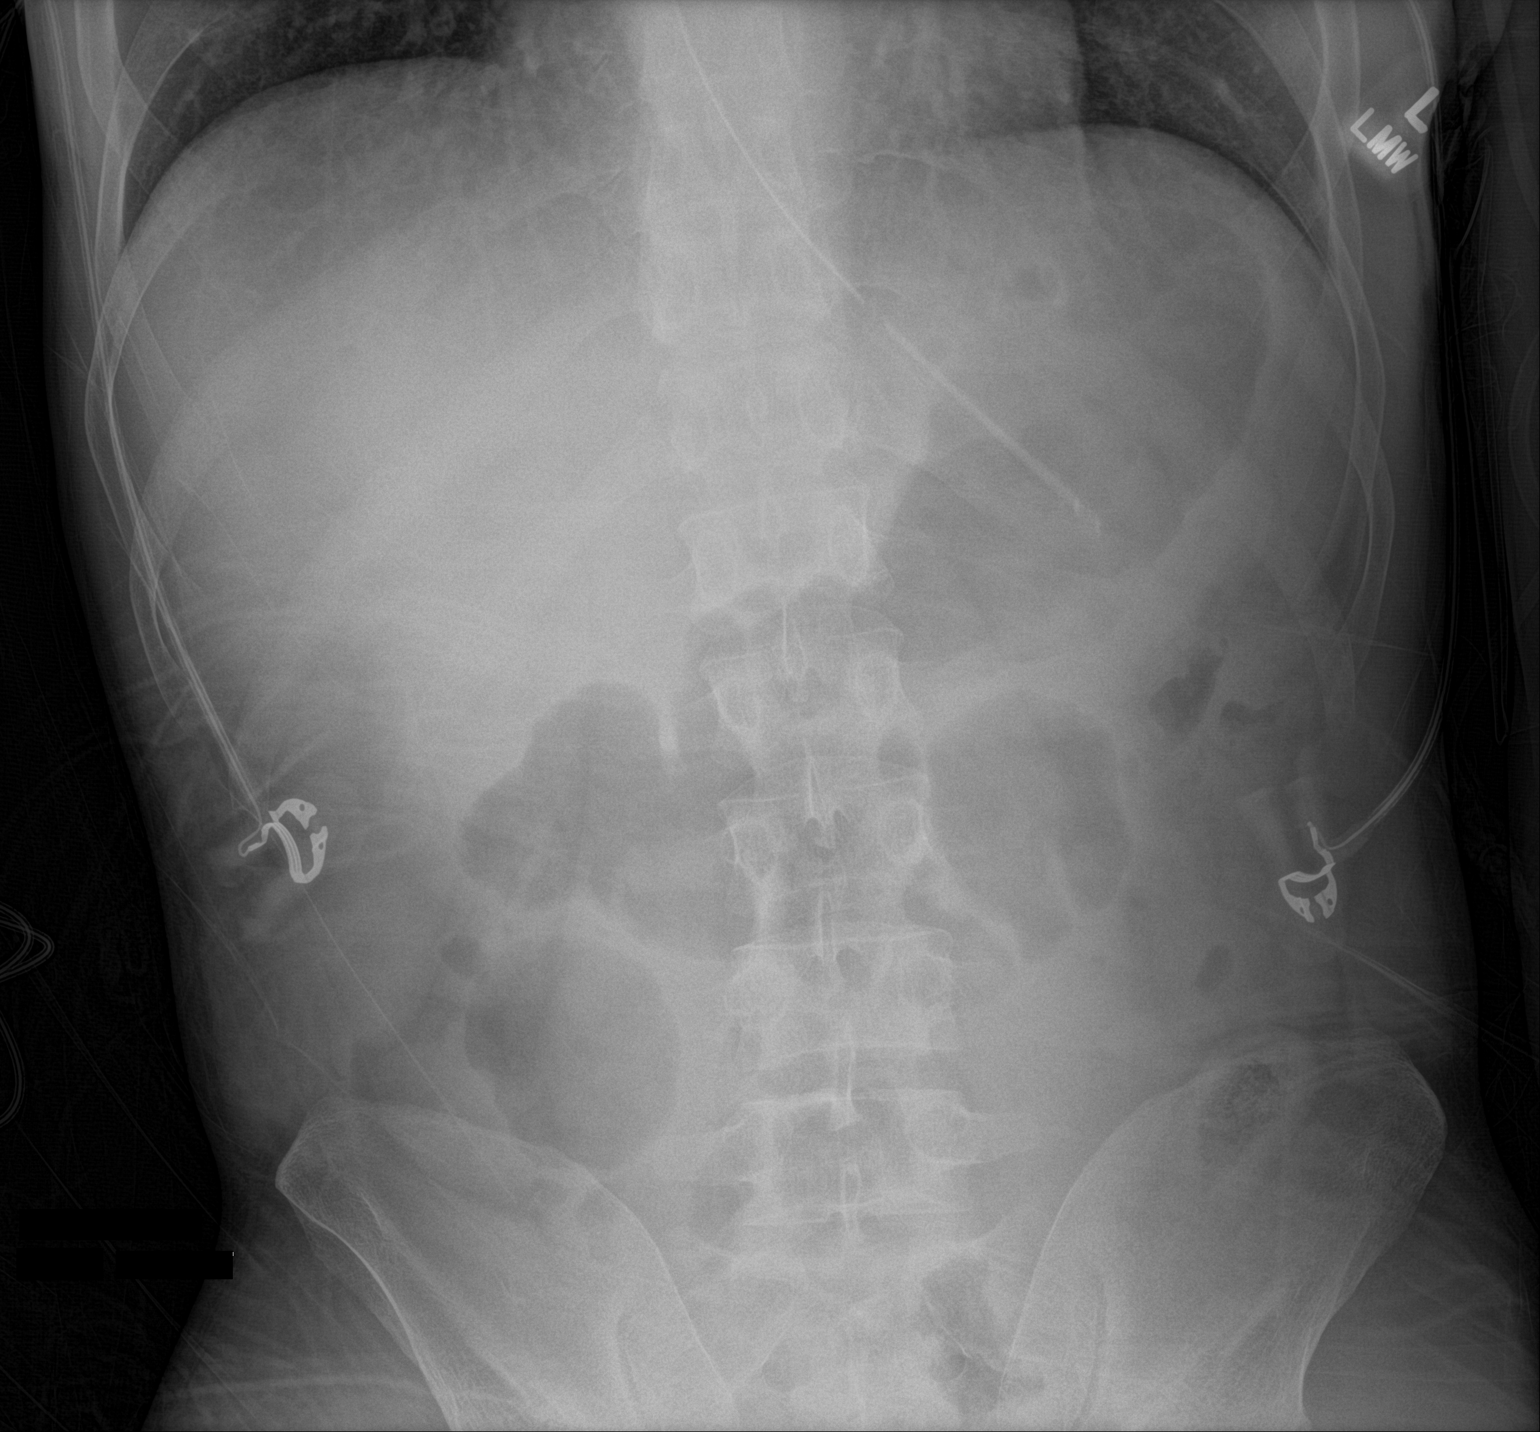

[1 of 1 positions shown; findings below may reference images not displayed]

FINDINGS: An enteric tube is partially visualized with side-port just distal
to the gastroesophageal junction and tip in the body of the stomach.
Recommend further advancing of the tube for optimal positioning. No
dilatation of small bowel. Air is noted in the colon. No free air.
IMPRESSION: Enteric tube with tip in the gastric body.

## 2019-12-29 IMAGING — DX DG ABDOMEN 1V
2 series · 2 of 2 positions shown · non-contrast
Comparison: None.

CLINICAL DATA: 32 y/o  M; post intubation.

EXAM:
ABDOMEN - 1 VIEW

[abdomen supine (1 of 2)]
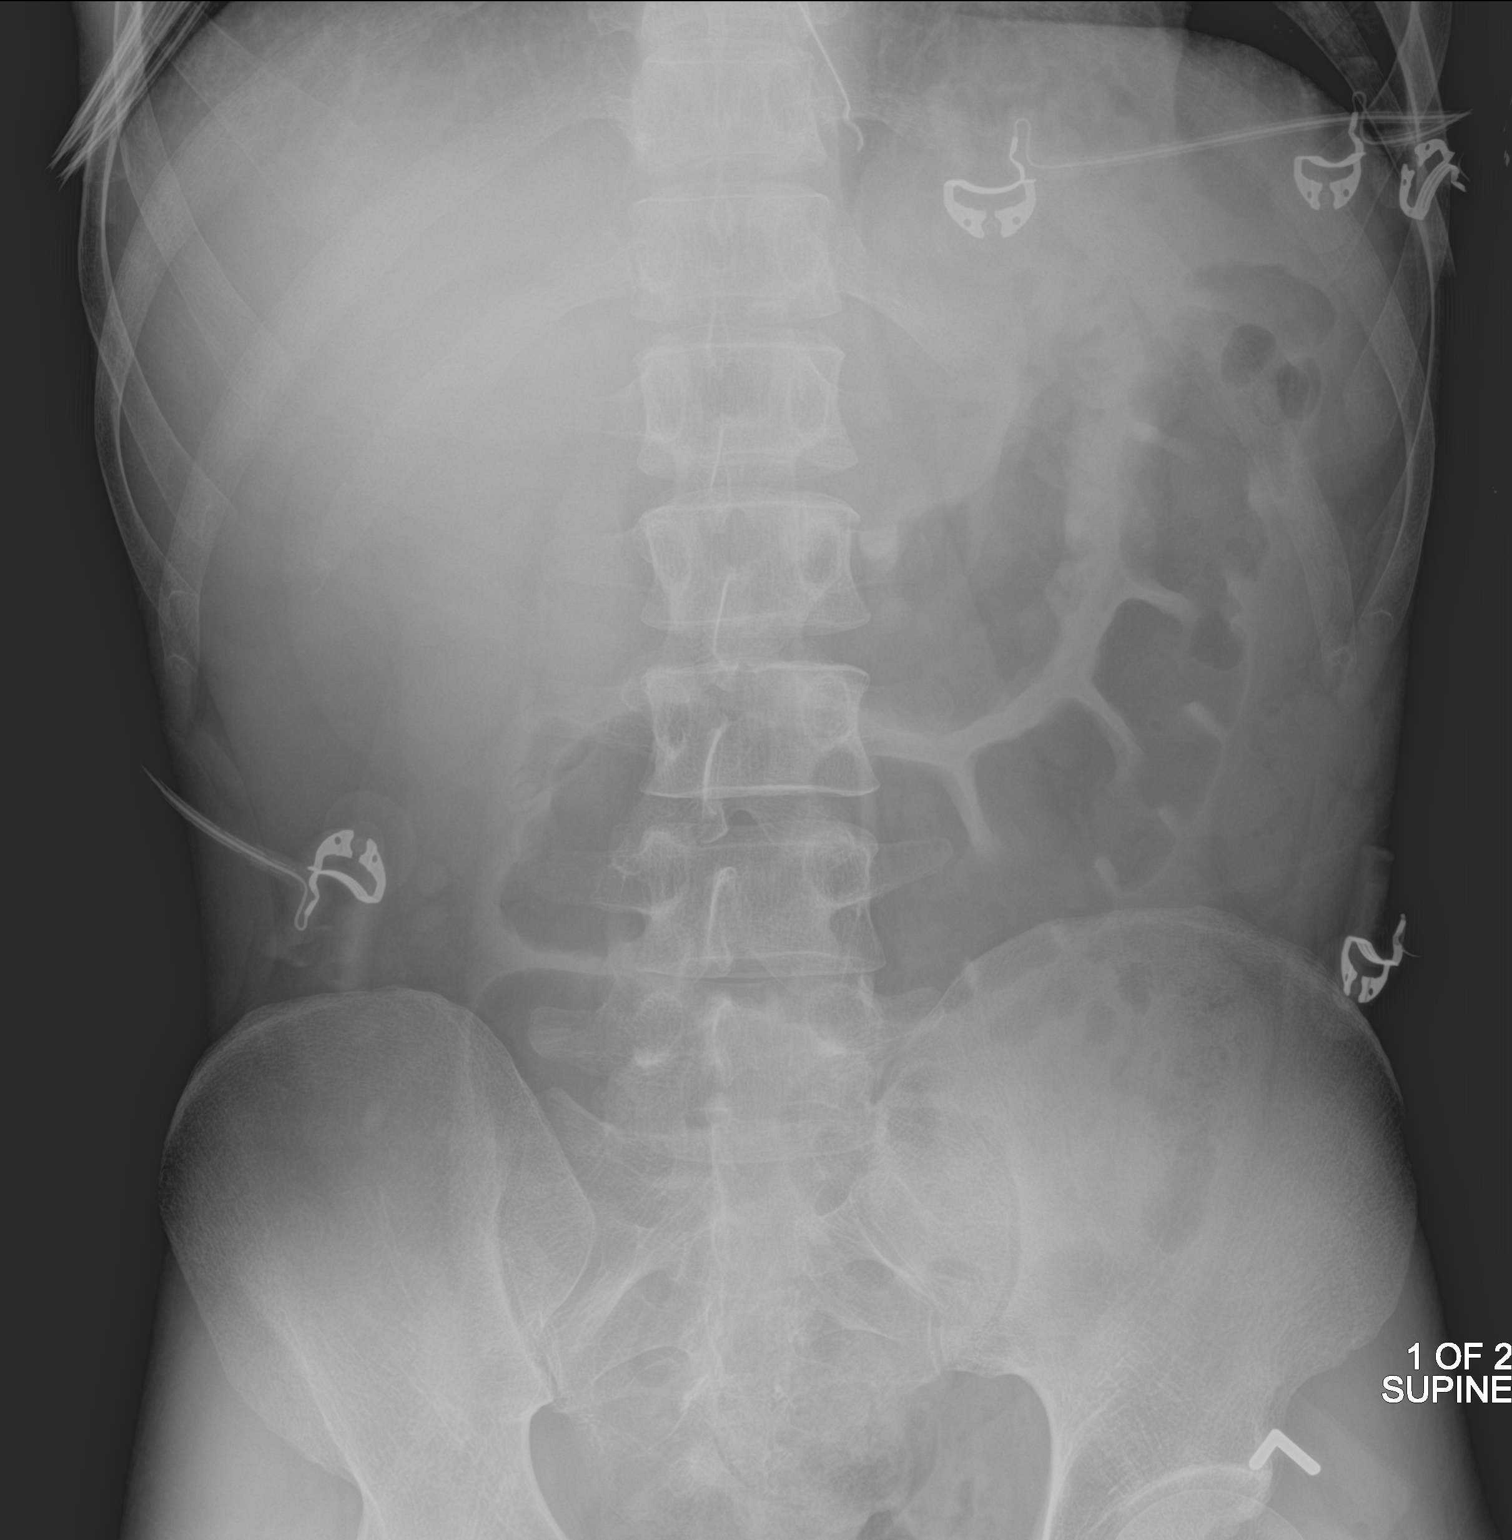

[abdomen supine (2 of 2)]
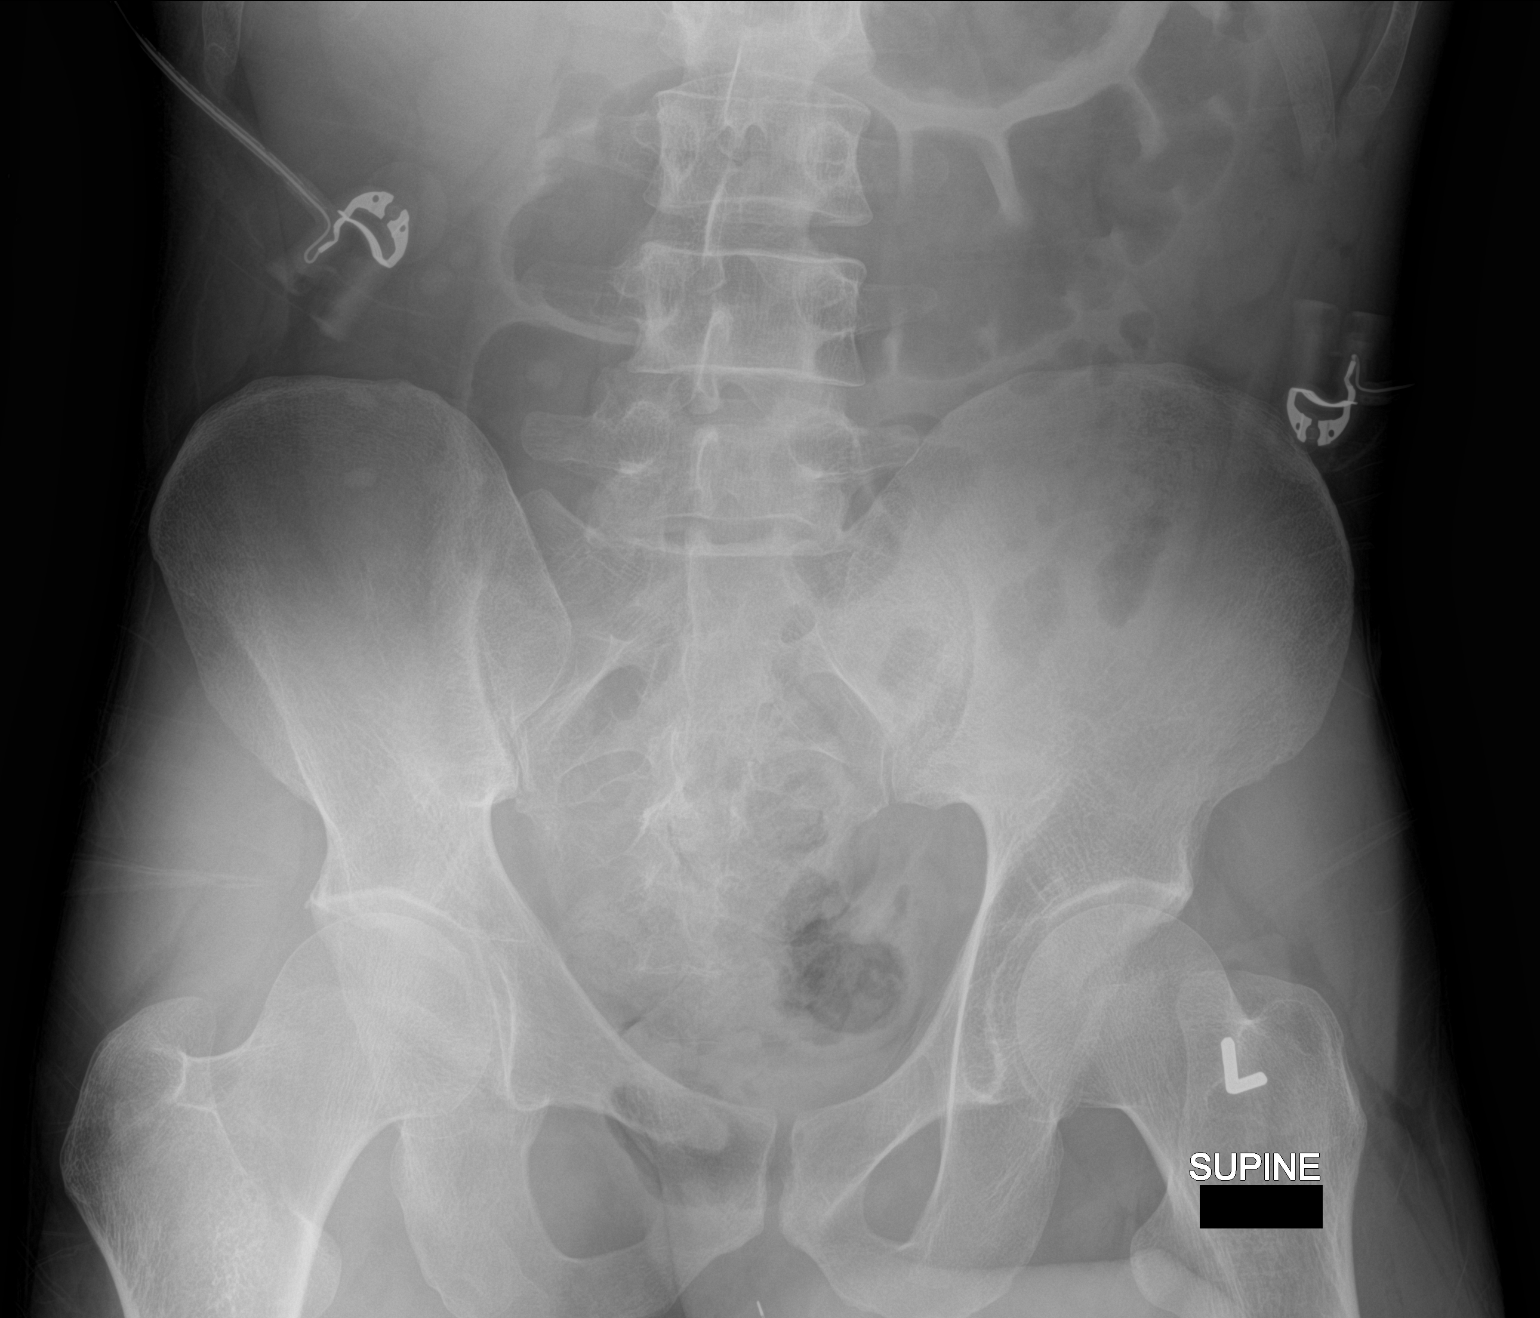

[2 of 2 positions shown; findings below may reference images not displayed]

FINDINGS: Normal bowel gas pattern. Enteric tube tip projects over the
gastroesophageal junction, advancement recommended. Mild
levocurvature of the lumbar spine. No acute osseous abnormality is
evident.
IMPRESSION: Enteric tube tip projects over gastroesophageal junction,
advancement recommended.

By: Sigfredo Shultz M.D.

## 2020-11-02 ENCOUNTER — Emergency Department (HOSPITAL_COMMUNITY): Payer: MEDICAID

## 2020-11-02 ENCOUNTER — Other Ambulatory Visit: Payer: Self-pay

## 2020-11-02 ENCOUNTER — Emergency Department
Admission: EM | Admit: 2020-11-02 | Discharge: 2020-11-02 | Discharge status: Against Medical Advice | Payer: MEDICAID | Attending: Family | Admitting: Family

## 2020-11-02 DIAGNOSIS — F1910 Other psychoactive substance abuse, uncomplicated: Secondary | ICD-10-CM | POA: Insufficient documentation

## 2020-11-02 DIAGNOSIS — Z5329 Procedure and treatment not carried out because of patient's decision for other reasons: Secondary | ICD-10-CM | POA: Insufficient documentation

## 2020-11-02 DIAGNOSIS — I4510 Unspecified right bundle-branch block: Secondary | ICD-10-CM

## 2020-11-02 DIAGNOSIS — M5020 Other cervical disc displacement, unspecified cervical region: Secondary | ICD-10-CM

## 2020-11-02 DIAGNOSIS — F17200 Nicotine dependence, unspecified, uncomplicated: Secondary | ICD-10-CM | POA: Insufficient documentation

## 2020-11-02 DIAGNOSIS — R7989 Other specified abnormal findings of blood chemistry: Secondary | ICD-10-CM

## 2020-11-02 DIAGNOSIS — D72829 Elevated white blood cell count, unspecified: Secondary | ICD-10-CM

## 2020-11-02 DIAGNOSIS — M5030 Other cervical disc degeneration, unspecified cervical region: Secondary | ICD-10-CM

## 2020-11-02 DIAGNOSIS — Z6820 Body mass index (BMI) 20.0-20.9, adult: Secondary | ICD-10-CM

## 2020-11-02 LAB — CBC WITH DIFF
BASOPHIL #: <0.1 10*3/uL (ref <=0.20)
BASOPHIL %: 1 %
EOSINOPHIL #: 0.28 10*3/uL (ref <=0.50)
EOSINOPHIL %: 2 %
HCT: 48.3 % (ref 38.9–52.0)
HGB: 16.3 g/dL (ref 13.4–17.5)
IMMATURE GRANULOCYTE #: <0.1 10*3/uL (ref <0.10)
IMMATURE GRANULOCYTE %: 0 % (ref 0–1)
LYMPHOCYTE #: 1.54 10*3/uL (ref 1.00–4.80)
LYMPHOCYTE %: 12 %
MCH: 30.2 pg (ref 26.0–32.0)
MCHC: 33.7 g/dL (ref 31.0–35.5)
MCV: 89.4 fL (ref 78.0–100.0)
MONOCYTE #: 1.24 10*3/uL — ABNORMAL HIGH (ref 0.20–1.10)
MONOCYTE %: 10 %
MPV: 10.1 fL (ref 8.7–12.5)
NEUTROPHIL #: 9.88 10*3/uL — ABNORMAL HIGH (ref 1.50–7.70)
NEUTROPHIL %: 75 %
PLATELETS: 256 10*3/uL (ref 150–400)
RBC: 5.4 10*6/uL (ref 4.50–6.10)
RDW-CV: 12.8 % (ref 11.5–15.5)
WBC: 13.1 10*3/uL — ABNORMAL HIGH (ref 3.7–11.0)

## 2020-11-02 LAB — URINALYSIS, MACRO/MICRO
BILIRUBIN: NEGATIVE mg/dL
BLOOD: NEGATIVE mg/dL
COLOR: NORMAL
GLUCOSE: NORMAL mg/dL
KETONES: NEGATIVE mg/dL
LEUKOCYTES: NEGATIVE WBCs/uL
NITRITE: NEGATIVE
PH: 6 (ref 5.0–8.0)
PROTEIN: 20 mg/dL
SPECIFIC GRAVITY: 1.029 (ref 1.005–1.030)
UROBILINOGEN: NORMAL mg/dL

## 2020-11-02 LAB — SEDIMENTATION RATE: ERYTHROCYTE SEDIMENTATION RATE (ESR): 7 mm/hr (ref 0–15)

## 2020-11-02 LAB — CREATINE KINASE (CK), TOTAL, SERUM: CREATINE KINASE: 227 U/L (ref <=250)

## 2020-11-02 LAB — BASIC METABOLIC PANEL
ANION GAP: 13 mmol/L
BUN/CREA RATIO: 13
BUN: 17 mg/dL (ref 10–25)
CALCIUM: 9.7 mg/dL (ref 8.8–10.3)
CHLORIDE: 106 mmol/L (ref 98–111)
CO2 TOTAL: 23 mmol/L (ref 21–35)
CREATININE: 1.28 mg/dL (ref <=1.30)
ESTIMATED GFR: >60 mL/min/{1.73_m2}
GLUCOSE: 47 mg/dL — CL (ref 70–110)
POTASSIUM: 3.9 mmol/L (ref 3.5–5.0)
SODIUM: 142 mmol/L (ref 135–145)

## 2020-11-02 LAB — HEPATIC FUNCTION PANEL
ALBUMIN: 5.1 g/dL — ABNORMAL HIGH (ref 3.2–4.8)
ALKALINE PHOSPHATASE: 119 U/L (ref 20–130)
ALT (SGPT): 11 U/L (ref <=52)
AST (SGOT): 22 U/L (ref <=35)
BILIRUBIN DIRECT: 0.1 mg/dL (ref <=0.3)
BILIRUBIN TOTAL: 0.7 mg/dL (ref 0.3–1.2)
PROTEIN TOTAL: 7.7 g/dL (ref 6.0–8.3)

## 2020-11-02 LAB — ETHANOL, SERUM: ETHANOL: <10 mg/dL (ref 0–10)

## 2020-11-02 LAB — C-REACTIVE PROTEIN(CRP),INFLAMMATION: CRP INFLAMMATION: 1.2 mg/L (ref <=5.0)

## 2020-11-02 LAB — LACTIC ACID LEVEL W/ REFLEX FOR LEVEL >2.0: LACTIC ACID: 2.8 mmol/L (ref <2.0)

## 2020-11-02 LAB — URINE DRUG SCREEN
AMPHETAMINES URINE: POSITIVE — AB
BARBITURATES URINE: NEGATIVE
BENZODIAZEPINES URINE: NEGATIVE
BUPRENORPHINE URINE: POSITIVE — AB
CANNABINOIDS URINE: POSITIVE — AB
COCAINE METABOLITES URINE: NEGATIVE
FENTANYL, RANDOM URINE: NEGATIVE
OPIATES URINE: NEGATIVE

## 2020-11-02 LAB — THYROXINE, FREE (FREE T4): THYROXINE (T4), FREE: 0.42 ng/dL — ABNORMAL LOW (ref 0.61–1.12)

## 2020-11-02 LAB — POC BLOOD GLUCOSE (RESULTS): GLUCOSE, POC: 76 mg/dl (ref 70–110)

## 2020-11-02 LAB — PROLACTIN: PROLACTIN: 9.6 ng/mL (ref 2.6–13.1)

## 2020-11-02 LAB — TROPONIN-I: TROPONIN I: <0.03 ng/mL (ref <=0.04)

## 2020-11-02 LAB — THYROID STIMULATING HORMONE WITH FREE T4 REFLEX: TSH: 39.43 u[IU]/mL — ABNORMAL HIGH (ref 0.450–5.330)

## 2020-11-02 MED ORDER — SODIUM CHLORIDE 0.9 % IV BOLUS
1000.00 mL | INJECTION | Status: AC
Start: 2020-11-02 — End: 2020-11-02
  Administered 2020-11-02: 11:00:00 1000 mL via INTRAVENOUS
  Administered 2020-11-02: 12:00:00 0 mL via INTRAVENOUS

## 2020-11-02 MED ORDER — DEXTROSE 50 % IN WATER (D50W) INTRAVENOUS SYRINGE
25.0000 g | INJECTION | INTRAVENOUS | Status: AC
Start: 2020-11-02 — End: 2020-11-02
  Administered 2020-11-02: 12:00:00 50 mL via INTRAVENOUS
  Filled 2020-11-02: qty 50

## 2020-11-02 MED ORDER — SODIUM CHLORIDE 0.9 % IV BOLUS
1000.0000 mL | INJECTION | Status: AC
Start: 2020-11-02 — End: 2020-11-02
  Administered 2020-11-02: 12:00:00 1000 mL via INTRAVENOUS
  Administered 2020-11-02: 0 mL via INTRAVENOUS

## 2020-11-02 NOTE — ED Nurses Note (Signed)
Patient stated he did not need detox or resources at this time.

## 2020-11-02 NOTE — ED Provider Notes (Signed)
Advanced Practice Provider: Estill Batten  Supervising Attending: Dr. Maryan Puls     Chief Complaint:  Chief Complaint   Patient presents with   . Substance Abuse     Pt reports smoking meth about an hour ago, has a hx of anxiety and substance abuse. Wife reports pt was "flopping like a fish." Pt also reports pain between shoulder blades. PT placed on 2 L NC for low oxygen saturation for EMS.     History of Present Illness:  Miguel Davis is a 35 y.o. male with no pertinent PMHx who presents via EMS with a chief complaint of substance abuse. Patient reports he was smoking Methamphetamine approximately 1 hour PTA when he reports falling, and EMS states wife reported he was "flopping around like a fish". Patient complains of upper back pain and neck pain, and notes he does have a hx seizures but does not take medication for them. Patient notes using Nitroglycerin last night and when asked why, he stated, "I'm lying, I didn't take that."  Patient confirms being prescribed Suboxone. Patient denies chest pain, fevers, as well as any other medical concerns or issues at this time.      History Limitations: None    Review of Systems:  Constitutional: No fever, chills or weakness  Skin: No rashes or diaphoresis  HENT: No congestion or rhinorrhea  Eyes: No vision changes, discharge  Cardio: No chest pain, palpitations or leg swelling   Respiratory: No cough, wheezing or SOB  GI:  No nausea, vomiting, diarrhea, constipation or abdominal pain  GU:  No dysuria, hematuria, polyuria  MSK: No joint, +upper neck and upper back pain  Neuro: No loss of sensation, headache, focal deficits or LOC +convulsions   Psych: No SI, HI +substance abuse       Allergies:  No Known Allergies  Past Medical History:  No past medical history on file.      Past Surgical History:  No past surgical history pertinent negatives on file.      Social History:  Social History     Tobacco Use   . Smoking status: Not on file   . Smokeless tobacco: Not  on file   Substance Use Topics   . Alcohol use: Not on file     Family History:  Family Medical History:    None         Physical Exam:  All nurse's notes reviewed.  Filed Vitals:    11/02/20 1041   BP: 123/76   Pulse: 96   Resp: 16   Temp: 37.1 C (98.7 F)   SpO2: 96%      Constitutional: Appears to be under the influence of drugs. No acute distress.  Alert and Oriented x3.  HENT:   Head: Normocephalic, Atraumatic  Mouth/Throat: Oropharynx is clear and moist.  Eyes:Conjunctivae without discharge.  Neck: Trachea midline.   Cardiovascular: Regular rate and rhythm  Pulmonary/Chest: Breath sounds equal bilaterally, good air movement. No respiratory distress. No wheezes, rales or chest tenderness.  Abdominal: Normal Bowel Sounds. Abdomen soft, no tenderness, no rebound or guarding.  Musculoskeletal: Midline C-spine tenderness. No obvious deformity, swelling. 2+ peripheral pulses  Skin: Warm and dry. No rash, erythema, pallor or cyanosis.  Psychiatric: Behavior is normal. Mood and affect congruent.  Neurological: Alert & Oriented x3. Grossly intact. Moves all extremities spontaneously.    Orders, Abnormal Labs and Imaging Results:  Results up to the Time the Disposition was Entered   BASIC METABOLIC PANEL -  Abnormal; Notable for the following components:       Result Value    GLUCOSE 47 (*)     All other components within normal limits    Narrative:     Estimated Glomerular Filtration Rate (eGFR) is calculated using the CKD-EPI (2021) equation, intended for patients 75 years of age and older. If gender is not documented or "unknown", there will be no eGFR calculation.   HEPATIC FUNCTION PANEL - Abnormal; Notable for the following components:    ALBUMIN 5.1 (*)     All other components within normal limits   THYROID STIMULATING HORMONE WITH FREE T4 REFLEX - Abnormal; Notable for the following components:    TSH 39.430 (*)     All other components within normal limits   URINE DRUG SCREEN - Abnormal; Notable for the  following components:    CANNABINOIDS URINE Positive - Not Confirmed (*)     AMPHETAMINES URINE Positive - Not Confirmed (*)     BUPRENORPHINE URINE Positive - Not Confirmed (*)     All other components within normal limits    Narrative:     Unconfirmed screening results must not be used for non-medical purposes   (e.g. employment testing, legal testing, etc.)    Cut-off values are listed below:  Amphetamines - < 1000 ng/mL  Barbiturates - < 200 ng/mL  Benzodiazepines - < 200 ng/mL  Cocaine - < 300 ng/mL  Opiate - < 300 ng/mL  Oxycodone - < 100 ng/mL  THC5 - < 50 ng/mL  Buprenorphine - < 5 ng/mL  Methadone - < 300 ng/mL  Fentanyl - <1 ng/mL   CBC WITH DIFF - Abnormal; Notable for the following components:    WBC 13.1 (*)     NEUTROPHIL # 9.88 (*)     MONOCYTE # 1.24 (*)     All other components within normal limits   URINALYSIS, MACRO/MICRO - Abnormal; Notable for the following components:    WBCS 3-5 (*)     HYALINE CASTS Occasional (*)     All other components within normal limits   LACTIC ACID LEVEL W/ REFLEX FOR LEVEL >2.0 - Abnormal; Notable for the following components:    LACTIC ACID 2.8 (*)     All other components within normal limits   THYROXINE, FREE (FREE T4) - Abnormal; Notable for the following components:    THYROXINE (T4), FREE 0.42 (*)     All other components within normal limits   TROPONIN-I - Normal   ETHANOL, SERUM - Normal   SEDIMENTATION RATE - Normal   C-REACTIVE PROTEIN(CRP),INFLAMMATION - Normal   CREATINE KINASE (CK), TOTAL, SERUM - Normal   PROLACTIN - Normal   POC BLOOD GLUCOSE (RESULTS) - Normal   CBC/DIFF    Narrative:     The following orders were created for panel order CBC/DIFF.  Procedure                               Abnormality         Status                     ---------                               -----------         ------  CBC WITH KXFG[[id number]]                Abnormal            Final result                 Please view results for these tests on the  individual orders.   ECG 12 LEAD - ED USE    Narrative:     Normal sinus rhythm  Incomplete right bundle branch block  Borderline ECG  When compared with ECG of 29-Apr-2019 16:13,  PREVIOUS ECG IS PRESENT   URINALYSIS WITH REFLEX MICROSCOPIC AND CULTURE IF POSITIVE    Narrative:     The following orders were created for panel order URINALYSIS WITH REFLEX MICROSCOPIC AND CULTURE IF POSITIVE.  Procedure                               Abnormality         Status                     ---------                               -----------         ------                     URINALYSIS, MACRO/MICRO[[id number]]      Abnormal            Final result                 Please view results for these tests on the individual orders.   XR CHEST AP    Narrative:     Miguel Davis    PROCEDURE DESCRIPTION: XR CHEST AP    CLINICAL INDICATION: chest pain    TECHNIQUE: 1 views / 1 images submitted.    COMPARISON: 04/29/2019      FINDINGS: No focal infiltrate or pneumothorax. No obvious pulmonary vascular congestion. Heart size and mediastinal contours are unchanged     CT BRAIN WO IV CONTRAST    Narrative:     Miguel Davis    PROCEDURE DESCRIPTION: CT BRAIN WO IV CONTRAST    CLINICAL INDICATION: fall    COMPARISON: No prior studies were compared.      FINDINGS: There is no evidence for intracranial mass-effect or hematoma. The   ventricular system remains midline and is within normal limits for size,   configuration, and symmetry. Review of the bone windows shows no   displaced fracture and there is fluid in the right maxillary sinus.     CT CERVICAL SPINE WO IV CONTRAST    Narrative:     Miguel Davis    PROCEDURE DESCRIPTION: CT CERVICAL SPINE WO IV CONTRAST    CLINICAL INDICATION: Cervical pain    COMPARISON: No prior studies were compared.      FINDINGS: Small area of dependent mucus at the sphenoid sinus.     Lack of intrathecal contrast evaluation of the spinal canal.     Cervical spine alignment is within normal limits.  Vertebral body heights and facet joints appear intact. No evidence of acute fracture or subluxation. At C5-C6 there appears to be a small posterior disc bulge which results in some narrowing of the anterior spinal canal. No significant osseous neural  foraminal encroachment.     LACTIC ACID TIMED   URINE CULTURE,ROUTINE   NS bolus infusion 1,000 mL (0 mL Intravenous Stopped 11/02/20 1149)   dextrose 50% (0.5 g/mL) injection - syringe (50 mL Intravenous Given 11/02/20 1203)   NS bolus infusion 1,000 mL (0 mL Intravenous Stopped 11/02/20 1312)       EKG: If performed, reviewed with attending physician.    MDM:       . Patient presents to the ER with chief complaint of methamphetamine abuse 1 hour prior to arrival. EMS was called by family because patient was "flopping around like a fish."      . Patient seen and examined. Patient vitals stable upon initial triage.      . Given patient's history, current symptoms, and exam; concern for, but not limited to methamphetamine abuse vs seizure vs cervical injury.    . Imaging reviewed. CT cervical spine with findings of C6 Patient complaining of significant cervical pain. Apparent small posterior disc bulge at the C6 resulting in some narrowing of the anterior spinal canal. Lack of intrathecal contrast limits evaluation of the spinal canal. MRI would further evaluate clinically indicated. Due to abnormal CT, MRI ordered for further evaluation.     . Labs reviewed. Remarkable labs above. WBC 13.6. Glucose 47 on labs but was repeated with bedside FS and was 76. 1/2 amp dextrose administered. Lactic 2.8. Patient given 1L of normal saline. Will recheck lactic at 1400.    Marland Kitchen Patient received normal saline 1L and 1/2 amp of dextrose.      . Patient became agitated and attempted to leave through ambulance bay doors with IV. He no longer wishes to be evaluated. MRI not obtained. Nursing was unable to obtain repeat finger stick prior to patient leaving because he refused. He was  escorted out by police and security.    Consults: none  Impression:   Encounter Diagnoses   Name Primary?   . Polysubstance abuse (CMS [place]) Yes   . 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 Elevated lactic acid level    . Leukocytosis, unspecified type      Disposition:  AMA      Follow Up:   [facility] - Emergency Department  [place] [id number]  [phone]    if you change your mind regarding further evaluation    Physiatry, Medical Office Building  [place] [address]  [phone]        Prescriptions:      Current Discharge Medication List      You have not been prescribed any medications.            No future appointments.      I am scribing for, and in the presence of Estill Batten APRN, FNP-BC for services provided on 11/02/2020.  Margurite Auerbach, [place]  11/02/2020, 10:51    Patient seen independently with co-signing physician present in clinic.     I personally performed the services described in this documentation, as scribed  in my presence, and it is both accurate  and complete.    Estill Batten, APRN,FNP-BC  11/02/2020, 13:27

## 2020-11-02 NOTE — ED Nurses Note (Signed)
Pt attempted to leave through ambulance door with IV in. Pt stopped by staff and IV taken out. Pt reporting that his dad is in the hospital and he can't wait any longer, that he has to go see him. We checked to see if pt's dad was here, he is not. Pt signed AMA forms and escorted out by security. Asher Muir, NP aware.

## 2020-11-03 LAB — ECG 12 LEAD - ED USE
Atrial Rate: 88 {beats}/min
Calculated P Axis: 58 degrees
Calculated T Axis: 59 degrees
PR Interval: 132 ms
QRS Duration: 100 ms
QT Interval: 388 ms
QTC Calculation: 469 ms
Ventricular rate: 88 {beats}/min

## 2020-11-04 LAB — URINE CULTURE,ROUTINE: URINE CULTURE: NO GROWTH

## 2020-11-16 ENCOUNTER — Emergency Department: Admission: EM | Admit: 2020-11-16 | Discharge: 2020-11-16 | Discharge status: Against Medical Advice | Payer: MEDICAID

## 2020-11-16 ENCOUNTER — Other Ambulatory Visit: Payer: Self-pay

## 2020-11-16 DIAGNOSIS — Z5321 Procedure and treatment not carried out due to patient leaving prior to being seen by health care provider: Secondary | ICD-10-CM | POA: Insufficient documentation
